# Patient Record
Sex: Male | Born: 1960 | Race: White | Hispanic: No | Marital: Married | Smoking: Former smoker
Health system: Southern US, Community
[De-identification: ages and names within clinical notes are randomized; demographics above are authoritative.]

## PROBLEM LIST (undated history)

## (undated) DIAGNOSIS — E109 Type 1 diabetes mellitus without complications: Secondary | ICD-10-CM

## (undated) DIAGNOSIS — G4733 Obstructive sleep apnea (adult) (pediatric): Secondary | ICD-10-CM

## (undated) DIAGNOSIS — IMO0002 Reserved for concepts with insufficient information to code with codable children: Secondary | ICD-10-CM

## (undated) DIAGNOSIS — E7800 Pure hypercholesterolemia, unspecified: Secondary | ICD-10-CM

## (undated) DIAGNOSIS — I4819 Other persistent atrial fibrillation: Secondary | ICD-10-CM

## (undated) DIAGNOSIS — I10 Essential (primary) hypertension: Secondary | ICD-10-CM

## (undated) DIAGNOSIS — M899 Disorder of bone, unspecified: Principal | ICD-10-CM

## (undated) DIAGNOSIS — D649 Anemia, unspecified: Secondary | ICD-10-CM

## (undated) DIAGNOSIS — D689 Coagulation defect, unspecified: Secondary | ICD-10-CM

## (undated) HISTORY — DX: Type 1 diabetes mellitus without complications: E10.9

## (undated) HISTORY — DX: Disorder of bone, unspecified: M89.9

## (undated) HISTORY — PX: WRIST FRACTURE SURGERY: SHX121

## (undated) HISTORY — DX: Other persistent atrial fibrillation: I48.19

## (undated) HISTORY — DX: Obstructive sleep apnea (adult) (pediatric): G47.33

## (undated) HISTORY — PX: KNEE SURGERY: SHX244

## (undated) HISTORY — DX: Anemia, unspecified: D64.9

## (undated) HISTORY — DX: Essential (primary) hypertension: I10

## (undated) HISTORY — DX: Reserved for concepts with insufficient information to code with codable children: IMO0002

## (undated) HISTORY — DX: Coagulation defect, unspecified: D68.9

## (undated) HISTORY — DX: Pure hypercholesterolemia, unspecified: E78.00

---

## 2005-02-25 ENCOUNTER — Ambulatory Visit: Payer: Self-pay | Admitting: Internal Medicine

## 2005-03-17 ENCOUNTER — Ambulatory Visit: Payer: Self-pay | Admitting: Internal Medicine

## 2005-05-21 ENCOUNTER — Ambulatory Visit: Payer: Self-pay | Admitting: Internal Medicine

## 2005-11-20 ENCOUNTER — Encounter: Admission: RE | Admit: 2005-11-20 | Discharge: 2005-11-20 | Payer: Self-pay | Admitting: Nephrology

## 2006-01-14 ENCOUNTER — Encounter: Admission: RE | Admit: 2006-01-14 | Discharge: 2006-04-14 | Payer: Self-pay | Admitting: Nephrology

## 2006-02-02 ENCOUNTER — Ambulatory Visit: Payer: Self-pay | Admitting: Internal Medicine

## 2006-02-09 ENCOUNTER — Ambulatory Visit: Payer: Self-pay | Admitting: Internal Medicine

## 2006-03-09 ENCOUNTER — Ambulatory Visit: Payer: Self-pay | Admitting: Internal Medicine

## 2006-07-07 ENCOUNTER — Ambulatory Visit: Payer: Self-pay | Admitting: Internal Medicine

## 2006-07-13 ENCOUNTER — Ambulatory Visit: Payer: Self-pay | Admitting: Endocrinology

## 2006-07-28 ENCOUNTER — Ambulatory Visit: Payer: Self-pay | Admitting: Endocrinology

## 2006-08-26 ENCOUNTER — Ambulatory Visit: Payer: Self-pay | Admitting: Endocrinology

## 2006-10-07 ENCOUNTER — Ambulatory Visit: Payer: Self-pay | Admitting: Endocrinology

## 2006-11-04 ENCOUNTER — Ambulatory Visit: Payer: Self-pay | Admitting: Endocrinology

## 2007-02-02 ENCOUNTER — Ambulatory Visit: Payer: Self-pay | Admitting: Endocrinology

## 2007-04-14 ENCOUNTER — Ambulatory Visit: Payer: Self-pay | Admitting: Endocrinology

## 2007-04-14 LAB — CONVERTED CEMR LAB: Hgb A1c MFr Bld: 7.7 % — ABNORMAL HIGH (ref 4.6–6.0)

## 2007-06-29 ENCOUNTER — Encounter: Payer: Self-pay | Admitting: Endocrinology

## 2007-06-29 DIAGNOSIS — I10 Essential (primary) hypertension: Secondary | ICD-10-CM

## 2007-06-29 DIAGNOSIS — E109 Type 1 diabetes mellitus without complications: Secondary | ICD-10-CM | POA: Insufficient documentation

## 2007-06-29 HISTORY — DX: Type 1 diabetes mellitus without complications: E10.9

## 2007-06-29 HISTORY — DX: Essential (primary) hypertension: I10

## 2007-07-26 ENCOUNTER — Telehealth: Payer: Self-pay | Admitting: Internal Medicine

## 2007-09-29 ENCOUNTER — Telehealth: Payer: Self-pay | Admitting: Internal Medicine

## 2007-10-13 ENCOUNTER — Emergency Department (HOSPITAL_COMMUNITY): Admission: EM | Admit: 2007-10-13 | Discharge: 2007-10-13 | Payer: Self-pay | Admitting: Emergency Medicine

## 2007-10-21 ENCOUNTER — Telehealth: Payer: Self-pay | Admitting: Internal Medicine

## 2007-12-03 ENCOUNTER — Encounter: Payer: Self-pay | Admitting: Endocrinology

## 2008-02-14 ENCOUNTER — Encounter: Payer: Self-pay | Admitting: Endocrinology

## 2008-03-15 ENCOUNTER — Encounter: Payer: Self-pay | Admitting: Endocrinology

## 2008-03-31 ENCOUNTER — Encounter: Payer: Self-pay | Admitting: Endocrinology

## 2008-04-04 ENCOUNTER — Ambulatory Visit: Payer: Self-pay | Admitting: Endocrinology

## 2008-04-19 ENCOUNTER — Encounter: Payer: Self-pay | Admitting: Endocrinology

## 2008-05-01 ENCOUNTER — Encounter: Payer: Self-pay | Admitting: Endocrinology

## 2008-05-12 ENCOUNTER — Encounter: Payer: Self-pay | Admitting: Endocrinology

## 2008-05-29 ENCOUNTER — Telehealth: Payer: Self-pay | Admitting: Endocrinology

## 2008-06-09 ENCOUNTER — Encounter: Payer: Self-pay | Admitting: Endocrinology

## 2008-06-22 ENCOUNTER — Telehealth (INDEPENDENT_AMBULATORY_CARE_PROVIDER_SITE_OTHER): Payer: Self-pay | Admitting: *Deleted

## 2008-07-24 ENCOUNTER — Telehealth: Payer: Self-pay | Admitting: Endocrinology

## 2008-10-13 ENCOUNTER — Telehealth: Payer: Self-pay | Admitting: Endocrinology

## 2008-11-17 ENCOUNTER — Telehealth (INDEPENDENT_AMBULATORY_CARE_PROVIDER_SITE_OTHER): Payer: Self-pay | Admitting: *Deleted

## 2008-12-07 ENCOUNTER — Ambulatory Visit: Payer: Self-pay | Admitting: Endocrinology

## 2009-01-30 ENCOUNTER — Ambulatory Visit: Payer: Self-pay | Admitting: Internal Medicine

## 2009-01-30 LAB — CONVERTED CEMR LAB
ALT: 28 U/L
AST: 29 U/L
Albumin: 3.7 g/dL
Alkaline Phosphatase: 61 U/L
BUN: 13 mg/dL
Basophils Absolute: 0 K/uL
Basophils Relative: 0.3 %
Bilirubin Urine: NEGATIVE
Bilirubin, Direct: 0.1 mg/dL
CO2: 30 meq/L
Calcium: 9.4 mg/dL
Chloride: 105 meq/L
Cholesterol: 142 mg/dL
Creatinine, Ser: 0.8 mg/dL
Creatinine,U: 130.4 mg/dL
Eosinophils Absolute: 0.5 K/uL
Eosinophils Relative: 6.2 % — ABNORMAL HIGH
GFR calc Af Amer: 133 mL/min
GFR calc non Af Amer: 110 mL/min
Glucose, Bld: 149 mg/dL — ABNORMAL HIGH
Glucose, Urine, Semiquant: NEGATIVE
HCT: 41.6 %
HDL: 37.6 mg/dL — ABNORMAL LOW
Hemoglobin: 14.2 g/dL
Hgb A1c MFr Bld: 7.1 % — ABNORMAL HIGH
Ketones, urine, test strip: NEGATIVE
LDL Cholesterol: 88 mg/dL
Lymphocytes Relative: 19.7 %
MCHC: 34.1 g/dL
MCV: 84.7 fL
Microalb Creat Ratio: 973.9 mg/g — ABNORMAL HIGH
Microalb, Ur: 127 mg/dL — ABNORMAL HIGH
Monocytes Absolute: 0.6 K/uL
Monocytes Relative: 7.4 %
Neutro Abs: 5.6 K/uL
Neutrophils Relative %: 66.4 %
Nitrite: NEGATIVE
Platelets: 199 K/uL
Potassium: 5.2 meq/L — ABNORMAL HIGH
RBC: 4.91 M/uL
RDW: 13.6 %
Sodium: 140 meq/L
Specific Gravity, Urine: 1.025
TSH: 1.99 u[IU]/mL
Total Bilirubin: 1 mg/dL
Total CHOL/HDL Ratio: 3.8
Total Protein: 7 g/dL
Triglycerides: 82 mg/dL
Urobilinogen, UA: 0.2
VLDL: 16 mg/dL
WBC Urine, dipstick: NEGATIVE
WBC: 8.4 10*3/microliter
pH: 5.5

## 2009-02-01 ENCOUNTER — Ambulatory Visit: Payer: Self-pay | Admitting: Internal Medicine

## 2009-02-12 ENCOUNTER — Telehealth: Payer: Self-pay | Admitting: Endocrinology

## 2009-03-13 ENCOUNTER — Telehealth: Payer: Self-pay | Admitting: Internal Medicine

## 2009-04-10 ENCOUNTER — Telehealth: Payer: Self-pay | Admitting: Endocrinology

## 2009-04-27 ENCOUNTER — Telehealth (INDEPENDENT_AMBULATORY_CARE_PROVIDER_SITE_OTHER): Payer: Self-pay | Admitting: *Deleted

## 2009-05-08 ENCOUNTER — Ambulatory Visit: Payer: Self-pay | Admitting: Endocrinology

## 2009-05-08 LAB — CONVERTED CEMR LAB: Hgb A1c MFr Bld: 6.9 % — ABNORMAL HIGH (ref 4.6–6.5)

## 2009-06-25 ENCOUNTER — Telehealth: Payer: Self-pay | Admitting: Endocrinology

## 2009-07-24 ENCOUNTER — Telehealth: Payer: Self-pay | Admitting: Endocrinology

## 2009-07-26 ENCOUNTER — Encounter: Payer: Self-pay | Admitting: Endocrinology

## 2009-11-12 ENCOUNTER — Telehealth: Payer: Self-pay | Admitting: Endocrinology

## 2009-12-03 ENCOUNTER — Telehealth: Payer: Self-pay | Admitting: Endocrinology

## 2010-03-18 ENCOUNTER — Telehealth: Payer: Self-pay | Admitting: Endocrinology

## 2010-04-23 ENCOUNTER — Telehealth: Payer: Self-pay | Admitting: Endocrinology

## 2010-05-08 ENCOUNTER — Telehealth: Payer: Self-pay | Admitting: Internal Medicine

## 2010-05-14 ENCOUNTER — Telehealth: Payer: Self-pay | Admitting: Endocrinology

## 2010-05-20 ENCOUNTER — Ambulatory Visit: Payer: Self-pay | Admitting: Endocrinology

## 2010-05-20 DIAGNOSIS — E7800 Pure hypercholesterolemia, unspecified: Secondary | ICD-10-CM | POA: Insufficient documentation

## 2010-05-20 DIAGNOSIS — R809 Proteinuria, unspecified: Secondary | ICD-10-CM | POA: Insufficient documentation

## 2010-05-20 HISTORY — DX: Pure hypercholesterolemia, unspecified: E78.00

## 2010-05-20 LAB — CONVERTED CEMR LAB: Hgb A1c MFr Bld: 7.5 % — ABNORMAL HIGH (ref 4.6–6.5)

## 2010-05-22 LAB — CONVERTED CEMR LAB
Albumin: 3.8 g/dL (ref 3.5–5.2)
Bilirubin, Direct: 0.1 mg/dL (ref 0.0–0.3)
CO2: 31 meq/L (ref 19–32)
Calcium: 9.2 mg/dL (ref 8.4–10.5)
Creatinine, Ser: 0.7 mg/dL (ref 0.4–1.5)
Creatinine,U: 104.5 mg/dL
GFR calc non Af Amer: 126.97 mL/min (ref >60)
HDL: 40.3 mg/dL (ref >39.00)
Total CHOL/HDL Ratio: 3
Total Protein: 6.9 g/dL (ref 6.0–8.3)
Triglycerides: 130 mg/dL (ref 0.0–149.0)
VLDL: 26 mg/dL (ref 0.0–40.0)

## 2010-05-24 ENCOUNTER — Telehealth: Payer: Self-pay | Admitting: Endocrinology

## 2010-06-24 ENCOUNTER — Telehealth: Payer: Self-pay | Admitting: Endocrinology

## 2010-07-03 ENCOUNTER — Telehealth: Payer: Self-pay | Admitting: Endocrinology

## 2010-07-10 ENCOUNTER — Telehealth: Payer: Self-pay | Admitting: Endocrinology

## 2010-07-25 ENCOUNTER — Telehealth: Payer: Self-pay | Admitting: Endocrinology

## 2010-08-13 ENCOUNTER — Encounter: Payer: Self-pay | Admitting: Endocrinology

## 2010-08-21 ENCOUNTER — Ambulatory Visit: Payer: Self-pay | Admitting: Endocrinology

## 2010-08-21 LAB — CONVERTED CEMR LAB: Hgb A1c MFr Bld: 8 % — ABNORMAL HIGH (ref 4.6–6.5)

## 2010-08-27 ENCOUNTER — Telehealth: Payer: Self-pay | Admitting: Endocrinology

## 2010-09-26 ENCOUNTER — Telehealth: Payer: Self-pay | Admitting: Endocrinology

## 2010-10-15 ENCOUNTER — Telehealth: Payer: Self-pay | Admitting: Endocrinology

## 2010-11-13 ENCOUNTER — Ambulatory Visit: Payer: Self-pay | Admitting: Endocrinology

## 2010-11-13 LAB — CONVERTED CEMR LAB: Hgb A1c MFr Bld: 8.2 % — ABNORMAL HIGH (ref 4.6–6.5)

## 2010-12-21 ENCOUNTER — Encounter: Payer: Self-pay | Admitting: Nephrology

## 2010-12-24 ENCOUNTER — Ambulatory Visit
Admission: RE | Admit: 2010-12-24 | Discharge: 2010-12-24 | Payer: Self-pay | Source: Home / Self Care | Attending: Internal Medicine | Admitting: Internal Medicine

## 2010-12-24 DIAGNOSIS — J069 Acute upper respiratory infection, unspecified: Secondary | ICD-10-CM | POA: Insufficient documentation

## 2010-12-31 ENCOUNTER — Encounter (INDEPENDENT_AMBULATORY_CARE_PROVIDER_SITE_OTHER): Payer: Self-pay | Admitting: *Deleted

## 2010-12-31 NOTE — Progress Notes (Signed)
Summary: novolog  Phone Note Refill Request Message from:  Fax from Pharmacy on July 25, 2010 1:39 PM  Refills Requested: Medication #1:  NOVOLOG FLEXPEN 100 UNIT/ML  SOLN three times a day (qac) 30-0-60 units   Dosage confirmed as above?Dosage Confirmed  Method Requested: Electronic Initial call taken by: Brenton Grills MA,  July 25, 2010 1:40 PM    Prescriptions: NOVOLOG FLEXPEN 100 UNIT/ML  SOLN (INSULIN ASPART) three times a day (qac) 30-0-60 units  #45 x 3   Entered by:   Brenton Grills MA   Authorized by:   Minus Breeding MD   Signed by:   Brenton Grills MA on 07/25/2010   Method used:   Electronically to        [facility]* (retail)       [address] [place], [place]  [address]       Ph: [phone] or [phone]       Fax: [phone]   RxID:   [id number]

## 2010-12-31 NOTE — Letter (Signed)
Summary: [facility]   Imported By: Sherian Rein 08/26/2010 08:35:38  _____________________________________________________________________  External Attachment:    Type:   Image     Comment:   External Document

## 2010-12-31 NOTE — Progress Notes (Signed)
Summary: lisinopril  Phone Note Refill Request Message from:  Fax from Pharmacy  Refills Requested: Medication #1:  LISINOPRIL 20 MG  TABS take 1 by mouth qd   Dosage confirmed as above?Dosage Confirmed   Last Refilled: 05/14/2010 Next Appointment Scheduled: 08/23/10 Initial call taken by: Brenton Grills MA,  July 03, 2010 10:30 AM    Prescriptions: LISINOPRIL 20 MG  TABS (LISINOPRIL) take 1 by mouth qd  #30 x 2   Entered by:   Brenton Grills MA   Authorized by:   Minus Breeding MD   Signed by:   Brenton Grills MA on 07/03/2010   Method used:   Faxed to ...       [facility]* (retail)       [address] [place], [place]  [address]       Ph: [phone] or [phone]       Fax: [phone]   RxID:   [id number]

## 2010-12-31 NOTE — Progress Notes (Signed)
  Phone Note Refill Request Message from:  Fax from Pharmacy  Refills Requested: Medication #1:  LANTUS SOLOSTAR 100 UNIT/ML SOLN Inject 90 unit subcutaneously every night   Dosage confirmed as above?Dosage Confirmed  Method Requested: Fax to Local Pharmacy Initial call taken by: Brenton Grills MA,  June 24, 2010 10:03 AM    Prescriptions: LANTUS SOLOSTAR 100 UNIT/ML SOLN (INSULIN GLARGINE) Inject 90 unit subcutaneously every night  #109ml x 6   Entered by:   Brenton Grills MA   Authorized by:   Minus Breeding MD   Signed by:   Brenton Grills MA on 06/24/2010   Method used:   Faxed to ...       [facility]* (retail)       [address] [place], [place]  [address]       Ph: [phone] or [phone]       Fax: [phone]   RxID:   [id number]

## 2010-12-31 NOTE — Progress Notes (Signed)
Summary: zocor  Phone Note Refill Request Message from:  Fax from Pharmacy on August 27, 2010 1:58 PM  Refills Requested: Medication #1:  ZOCOR 80 MG  TABS take 1 at bedtime   Dosage confirmed as above?Dosage Confirmed   Last Refilled: 07/25/2010  Method Requested: Electronic Initial call taken by: Brenton Grills MA,  August 27, 2010 1:58 PM    Prescriptions: ZOCOR 80 MG  TABS (SIMVASTATIN) take 1 at bedtime  #30 x 3   Entered by:   Brenton Grills MA   Authorized by:   Minus Breeding MD   Signed by:   Brenton Grills MA on 08/27/2010   Method used:   Electronically to        [facility]* (retail)       [address] [place], [place]  [address]       Ph: [phone] or [phone]       Fax: [phone]   RxID:   [id number]

## 2010-12-31 NOTE — Progress Notes (Signed)
  Phone Note Refill Request Message from:  Fax from Pharmacy  Refills Requested: Medication #1:  NOVOLOG FLEXPEN 100 UNIT/ML  SOLN three times a day (qac) 30-0-60 units   Dosage confirmed as above?Dosage Confirmed Initial call taken by: Brenton Grills MA,  May 24, 2010 4:47 PM    Prescriptions: NOVOLOG FLEXPEN 100 UNIT/ML  SOLN (INSULIN ASPART) three times a day (qac) 30-0-60 units  #45 x 1   Entered by:   Brenton Grills MA   Authorized by:   Minus Breeding MD   Signed by:   Brenton Grills MA on 05/24/2010   Method used:   Electronically to        [facility]* (retail)       [address] [place], [place]  [address]       Ph: [phone] or [phone]       Fax: [phone]   RxID:   [id number]

## 2010-12-31 NOTE — Assessment & Plan Note (Signed)
Summary: 3 MTH FU STC   Vital Signs:  Patient profile:   50 year old male Height:      75 inches (190.50 cm) Weight:      305 pounds (138.64 kg) BMI:     38.26 O2 Sat:      96 % on Room air Temp:     98.5 degrees F (36.94 degrees C) oral Pulse rate:   89 / minute BP sitting:   122 / 84  (left arm) Cuff size:   large  Vitals Entered By: Brenton Grills MA (August 21, 2010 7:58 AM)  O2 Flow:  Room air CC: 3 month F/U/aj Is Patient Diabetic? Yes   CC:  3 month F/U/aj.  History of Present Illness: pt states he feels well in general.   no cbg record, but states cbg's are highest at hs, and in am (200).  it is lowest in the afternoon.  he says it sometimes goes low in the middle of the night, if he does not eat hs-snack.    Current Medications (verified): 1)  Novolog Flexpen 100 Unit/ml  Soln (Insulin Aspart) .... Three Times A Day (Qac) 30-0-60 Units 2)  Lisinopril 20 Mg  Tabs (Lisinopril) .... Take 1 By Mouth Qd 3)  Zocor 80 Mg  Tabs (Simvastatin) .... Take 1 At Bedtime 4)  Lantus Solostar 100 Unit/ml Soln (Insulin Glargine) .... Inject 90 Unit Subcutaneously Every Night 5)  Bd U/f Iii Short Pen Needle 31g X 8 Mm  Misc (Insulin Pen Needle) .... Use As Directed 6)  Onetouch Ultra Test   Strp (Glucose Blood) .... Check Blood Glucose 5 Per Day, 250.01, and Lancets  Allergies (verified): No Known Drug Allergies  Past History:  Past Medical History: Last updated: 02/01/2009 Diabetes mellitus, type I Hypertension Dyslipidemia DM Nephropathy  Review of Systems  The patient denies syncope.    Physical Exam  General:  obese.  no distress  Skin:  insulin injection sites at anterior thighs are normal  Additional Exam:   Hemoglobin A1C       [H]  8.0 %   Impression & Recommendations:  Problem # 1:  DIABETES MELLITUS, TYPE I (ICD-250.01) needs increased rx  Medications Added to Medication List This Visit: 1)  Novolog Flexpen 100 Unit/ml Soln (Insulin aspart) ....  Three times a day (qac) 40-0-70 units 2)  Lantus Solostar 100 Unit/ml Soln (Insulin glargine) .... Inject 80 unit subcutaneously every night  Other Orders: Admin 1st Vaccine ([address]) Flu Vaccine 34yrs + ([address]) TLB-A1C / Hgb A1C (Glycohemoglobin) (83036-A1C) Est. Patient Level III ([address])  Patient Instructions: 1)  blood tests are being ordered for you today.  please call [phone] to hear your test results. 2)  pending the test results, please decrease lantus to 80 units at bedtime.   3)  increase novolog to (just before each meal) 40-0-70 units 4)  Please schedule a follow-up appointment in 3 months. 5)  check your blood sugar 2 times a day.  vary the time of day when you check, between before the 3 meals, and at bedtime.  also check if you have symptoms of your blood sugar being too high or too low.  please keep a record of the readings and bring it to your next appointment here.  please call us sooner if you are having low blood sugar episodes. 6)  (update: i left message on phone-tree:  rx as we discussed) Prescriptions: LANTUS SOLOSTAR 100 UNIT/ML SOLN (INSULIN GLARGINE) Inject 80 unit subcutaneously every  night  #2 boxes x 11   Entered and Authorized by:   Minus Breeding MD   Signed by:   Minus Breeding MD on 08/21/2010   Method used:   Electronically to        [facility]* (retail)       [address] [place], [place]  [address]       Ph: [phone] or [phone]       Fax: [phone]   RxID:   [id number] NOVOLOG FLEXPEN 100 UNIT/ML  SOLN (INSULIN ASPART) three times a day (qac) 40-0-70 units  #3 boxes x 11   Entered and Authorized by:   Minus Breeding MD   Signed by:   Minus Breeding MD on 08/21/2010   Method used:   Electronically to        [facility]* (retail)       [address] [place], [place]  [address]       Ph: [phone] or  [phone]       Fax: [phone]   RxID:   [id number]   Flu Vaccine Consent Questions     Do you have a history of severe allergic reactions to this vaccine? no    Any prior history of allergic reactions to egg and/or gelatin? no    Do you have a sensitivity to the preservative Thimersol? no    Do you have a past history of Guillan-Barre Syndrome? no    Do you currently have an acute febrile illness? no    Have you ever had a severe reaction to latex? no    Vaccine information given and explained to patient? yes    Are you currently pregnant? no    Lot Number:[id number]   Exp Date:05/31/2011   Site Given  Left Deltoid IMlu

## 2010-12-31 NOTE — Assessment & Plan Note (Signed)
Summary: f/u,refill/#/cd   Vital Signs:  Patient profile:   50 year old male Weight:      298 pounds BMI:     37.38 Temp:     97.8 degrees F Pulse rate:   90 / minute BP sitting:   130 / 88  (left arm) Cuff size:   large  Vitals Entered By: Lamar Sprinkles, CMA (May 20, 2010 8:08 AM) CC: F/u Needs refills / SD Comments Lantus needs to be removed from pt's allergy list, he is currently taking medication w/no problems   CC:  F/u Needs refills / SD.  History of Present Illness: pt states weight gain despite his efforts.  he has mild hypoglycemia when he eats a smaller-than-expected meal.  no cbg record, but states cbg's are lowest in the afternoon.    Current Medications (verified): 1)  Novolog Flexpen 100 Unit/ml  Soln (Insulin Aspart) .... Three Times A Day (Qac) 30-15-60 Units 2)  Lisinopril 20 Mg  Tabs (Lisinopril) .... Take 1 By Mouth Qd 3)  Zocor 80 Mg  Tabs (Simvastatin) .... Take 1 At Bedtime 4)  Lantus Solostar 100 Unit/ml Soln (Insulin Glargine) .... Inject 90 Unit Subcutaneously Every Night 5)  Bd U/f Iii Short Pen Needle 31g X 8 Mm  Misc (Insulin Pen Needle) .... Use As Directed 6)  Onetouch Ultra Test   Strp (Glucose Blood) .... Check Blood Glucose 5 Per Day, 250.01, and Lancets  Past History:  Past Medical History: Last updated: 02/01/2009 Diabetes mellitus, type I Hypertension Dyslipidemia DM Nephropathy  Social History: Reviewed history from 02/01/2009 and no changes required. 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    Physical Exam  General:  obese.   Pulses:  dorsalis pedis intact bilat.  no carotid bruit  Extremities:  no deformity.  no ulcer on the feet.  feet are of normal color and temp.  no edema  Neurologic:  cn 2-12 grossly intact.   readily moves all 4's.    Additional Exam:  Hemoglobin A1C       [H]  7.5 %     Impression & Recommendations:  Problem # 1:  DIABETES MELLITUS,  TYPE I (ICD-250.01) needs increased rx  Problem # 2:  PROTEINURIA, MILD (ICD-791.0) Assessment: New  Medications Added to Medication List This Visit: 1)  Novolog Flexpen 100 Unit/ml Soln (Insulin aspart) .... Three times a day (qac) 30-0-60 units  Other Orders: TLB-Hemoglobin (Hgb) (85018-HGB) TLB-Microalbumin/Creat Ratio, Urine (82043-MALB) TLB-Lipid Panel (80061-LIPID) TLB-BMP (Basic Metabolic Panel-BMET) (80048-METABOL) TLB-Hepatic/Liver Function Pnl (80076-HEPATIC) TLB-TSH (Thyroid Stimulating Hormone) (84443-TSH) Est. Patient Level III ([address])  Patient Instructions: 1)  continue lantus to 90 units at bedtime 2)  reduce novolog to (just before each meal) 30-0-60 units 3)  Please schedule a follow-up appointment in 3 months. 4)  check your blood sugar 2 times a day.  vary the time of day when you check, between before the 3 meals, and at bedtime.  also check if you have symptoms of your blood sugar being too high or too low.  please keep a record of the readings and bring it to your next appointment here.  please call us sooner if you are having low blood sugar episodes. 5)  (update: i left message on phone-tree:  call back, and tell me what time of day cbg is highest, so we can safely increase insulin).

## 2010-12-31 NOTE — Progress Notes (Signed)
Summary: Rx refill req  Phone Note Refill Request Message from:  Patient  Refills Requested: Medication #1:  LISINOPRIL 20 MG  TABS take 1 by mouth qd   Dosage confirmed as above?Dosage Confirmed   Supply Requested: 1 month Pt had appt with [facility]E that was re-scheduled to 06/20 due to MD illness.  Next Appointment Scheduled: 06.20.2011 Initial call taken by: Margaret Pyle, CMA,  May 14, 2010 4:44 PM    Prescriptions: LISINOPRIL 20 MG  TABS (LISINOPRIL) take 1 by mouth qd  #30 x 0   Entered by:   Margaret Pyle, CMA   Authorized by:   Minus Breeding MD   Signed by:   Margaret Pyle, CMA on 05/14/2010   Method used:   Electronically to        [facility]* (retail)       [address] [place], [place]  [address]       Ph: [phone] or [phone]       Fax: [phone]   RxID:   [id number]

## 2010-12-31 NOTE — Progress Notes (Signed)
Summary: One touch test strip refill  Phone Note Refill Request Message from:  Fax from Pharmacy on December 03, 2009 8:38 AM  Refills Requested: Medication #1:  ONETOUCH ULTRA TEST   STRP CHECK BLOOD GLUCOSE 5 per day   Dosage confirmed as above?Dosage Confirmed Initial call taken by: Josph Macho CMA,  December 03, 2009 8:38 AM    Prescriptions: [place] TEST   STRP (GLUCOSE BLOOD) CHECK BLOOD GLUCOSE 5 per day, 250.01, and lancets  #150 x 3   Entered by:   Josph Macho CMA   Authorized by:   Minus Breeding MD   Signed by:   Josph Macho CMA on 12/03/2009   Method used:   Electronically to        [facility]* (retail)       [address] [place], [place]  [address]       Ph: [phone] or [phone]       Fax: [phone]   RxID:   [id number]

## 2010-12-31 NOTE — Progress Notes (Signed)
  Phone Note Refill Request Message from:  Fax from Pharmacy on March 18, 2010 9:00 AM  Refills Requested: Medication #1:  ZOCOR 80 MG  TABS take 1 at bedtime   Dosage confirmed as above?Dosage Confirmed Initial call taken by: Josph Macho RMA,  March 18, 2010 9:00 AM    Prescriptions: ZOCOR 80 MG  TABS (SIMVASTATIN) take 1 at bedtime  #30 x 4   Entered by:   Josph Macho RMA   Authorized by:   Minus Breeding MD   Signed by:   Josph Macho RMA on 03/18/2010   Method used:   Electronically to        [facility]* (retail)       [address] [place], [place]  [address]       Ph: [phone] or [phone]       Fax: [phone]   RxID:   [id number]

## 2010-12-31 NOTE — Progress Notes (Signed)
Summary: rx refill req  Phone Note Refill Request Message from:  Fax from Pharmacy on October 15, 2010 11:54 AM  Refills Requested: Medication #1:  LISINOPRIL 20 MG  TABS take 1 by mouth qd   Dosage confirmed as above?Dosage Confirmed   Last Refilled: 09/12/2010  Method Requested: Electronic Next Appointment Scheduled: 11/13/2010 Initial call taken by: Brenton Grills CMA Duncan Dull),  October 15, 2010 11:55 AM    Prescriptions: LISINOPRIL 20 MG  TABS (LISINOPRIL) take 1 by mouth qd  #30 x 5   Entered by:   Brenton Grills CMA (AAMA)   Authorized by:   Minus Breeding MD   Signed by:   Brenton Grills CMA (AAMA) on 10/15/2010   Method used:   Electronically to        [facility]* (retail)       [address] [place], [place]  [address]       Ph: [phone] or [phone]       Fax: [phone]   RxID:   [id number]

## 2010-12-31 NOTE — Progress Notes (Signed)
  Phone Note Refill Request Message from:  Fax from Pharmacy on July 10, 2010 11:23 AM  Refills Requested: Medication #1:  ONETOUCH ULTRA TEST   STRP CHECK BLOOD GLUCOSE 5 per day   Dosage confirmed as above?Dosage Confirmed  Method Requested: Electronic Initial call taken by: Brenton Grills MA,  July 10, 2010 11:39 AM    Prescriptions: [place]n Bound TEST   STRP (GLUCOSE BLOOD) CHECK BLOOD GLUCOSE 5 per day, 250.01, and lancets  #150 x 2   Entered by:   Brenton Grills MA   Authorized by:   Minus Breeding MD   Signed by:   Brenton Grills MA on 07/10/2010   Method used:   Electronically to        [facility]* (retail)       [address] [place], [place]  [address]       Ph: [phone] or [phone]       Fax: [phone]   RxID:   [id number]

## 2010-12-31 NOTE — Progress Notes (Signed)
Summary: rx refill req  Phone Note Refill Request Message from:  Fax from Pharmacy on September 26, 2010 1:25 PM  Refills Requested: Medication #1:  ONETOUCH ULTRA TEST   STRP CHECK BLOOD GLUCOSE 5 per day   Dosage confirmed as above?Dosage Confirmed   Last Refilled: 08/27/2010  Method Requested: Electronic Next Appointment Scheduled: 11/13/2010 Initial call taken by: Brenton Grills MA,  September 26, 2010 1:25 PM    Prescriptions: [place]n Bound TEST   STRP (GLUCOSE BLOOD) CHECK BLOOD GLUCOSE 5 per day, 250.01, and lancets  #150 x 3   Entered by:   Brenton Grills MA   Authorized by:   Minus Breeding MD   Signed by:   Brenton Grills MA on 09/26/2010   Method used:   Electronically to        [facility]* (retail)       [address] [place], [place]  [address]       Ph: [phone] or [phone]       Fax: [phone]   RxID:   [id number]

## 2010-12-31 NOTE — Progress Notes (Signed)
Summary: lisinopril deined  Phone Note From Pharmacy Call back at [phone] fax   Caller: [facility] Call For: swords  Summary of Call: refill lisinopril 20mg  Take 1 tablet by mouth once a day . Last seen 02/01/10.  This is denied as needs ov. Initial call taken by: Gladis Riffle, RN,  May 08, 2010 11:32 AM

## 2010-12-31 NOTE — Progress Notes (Signed)
  Phone Note Refill Request Message from:  Fax from Pharmacy on Apr 23, 2010 8:54 AM  Refills Requested: Medication #1:  ONETOUCH ULTRA TEST   STRP CHECK BLOOD GLUCOSE 5 per day   Dosage confirmed as above?Dosage Confirmed Initial call taken by: Josph Macho RMA,  Apr 23, 2010 8:54 AM    Prescriptions: [place]n Bound TEST   STRP (GLUCOSE BLOOD) CHECK BLOOD GLUCOSE 5 per day, 250.01, and lancets  #150 x 1   Entered by:   Josph Macho RMA   Authorized by:   Minus Breeding MD   Signed by:   Josph Macho RMA on 04/23/2010   Method used:   Electronically to        [facility]* (retail)       [address] [place], [place]  [address]       Ph: [phone] or [phone]       Fax: [phone]   RxID:   [id number]

## 2011-01-02 ENCOUNTER — Telehealth: Payer: Self-pay | Admitting: *Deleted

## 2011-01-02 DIAGNOSIS — R05 Cough: Secondary | ICD-10-CM

## 2011-01-02 MED ORDER — HYDROCODONE-HOMATROPINE 5-1.5 MG/5ML PO SYRP: 240.0000 mL | ORAL_SOLUTION | Freq: Three times a day (TID) | ORAL | Status: AC | PRN

## 2011-01-02 NOTE — Telephone Encounter (Signed)
Refill hydromet cough syrup 1 tsp po tid prn for cough

## 2011-01-02 NOTE — Telephone Encounter (Signed)
Ok x one

## 2011-01-02 NOTE — Assessment & Plan Note (Signed)
Summary: cough/njr   Vital Signs:  Patient profile:   50 year old male Weight:      318 pounds Temp:     98.1 degrees F oral BP sitting:   124 / 82  (left arm) Cuff size:   large  Vitals Entered By: Alfred Levins, CMA (December 24, 2010 11:12 AM) CC: cough and congestion x2 days, coughed so hard ribs hurt   CC:  cough and congestion x2 days and coughed so hard ribs hurt.  History of Present Illness: cough for 3 days, no fever or chills,  no sweats.  cough is nonproductive nocturnal cough is bothersome  Current Medications (verified): 1)  Novolog Flexpen 100 Unit/ml  Soln (Insulin Aspart) .... Three Times A Day (Qac) 40-0-80 Units 2)  Lisinopril 20 Mg  Tabs (Lisinopril) .... Take 1 By Mouth Qd 3)  Zocor 80 Mg  Tabs (Simvastatin) .... Take 1 At Bedtime 4)  Lantus Solostar 100 Unit/ml Soln (Insulin Glargine) .... Inject 80 Unit Subcutaneously Every Night 5)  Bd U/f Iii Short Pen Needle 31g X 8 Mm  Misc (Insulin Pen Needle) .... Use As Directed 6)  Onetouch Ultra Test   Strp (Glucose Blood) .... 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   Eyes:  pupils equal and pupils round.   Lungs:  Normal respiratory effort, chest expands symmetrically. Lungs are clear to auscultation, no crackles or wheezes.   Impression & Recommendations:  Problem # 1:  URI (ICD-465.9) no evidence of bacterial infection. call for any concerns, increased sxs, fever, persistence of sxs,  wheeze, SOB.   His updated medication list for this problem includes:    Hydrocodone-homatropine 5-1.5 Mg Tabs (Hydrocodone-homatropine) .Marland Kitchen... 1 tsp three times a day as needed  Complete Medication List: 1)  Novolog Flexpen 100 Unit/ml Soln (Insulin aspart) .... Three times a day (qac) 40-0-80 units 2)  Lisinopril 20 Mg Tabs (Lisinopril) .... Take 1 by mouth qd 3)  Zocor 80 Mg Tabs (Simvastatin) .... Take 1 at bedtime 4)  Lantus Solostar 100 Unit/ml Soln (Insulin glargine) .... Inject 80 unit subcutaneously every night 5)  Bd U/f Iii Short Pen Needle 31g X 8 Mm Misc (Insulin pen needle) .... Use as directed 6)  Onetouch Ultra Test Strp (Glucose blood) .... Check blood glucose 5 per day, 250.01, and lancets 7)  Hydrocodone-homatropine 5-1.5 Mg Tabs (Hydrocodone-homatropine) .Marland Kitchen.. 1 tsp three times a day as needed  Other Orders: Gastroenterology Referral (GI) Prescriptions: HYDROCODONE-HOMATROPINE 5-1.5 MG TABS (HYDROCODONE-HOMATROPINE) 1 tsp three times a day as needed  #240 cc x 0   Entered and Authorized by:   Birdie Sons MD   Signed by:   Birdie Sons MD on 12/24/2010   Method used:   Print then Give to Patient   RxID:   [id number]    Orders Added: 1)  Gastroenterology Referral [GI]

## 2011-01-02 NOTE — Assessment & Plan Note (Signed)
Summary: 3 MTH FU---STC   Vital Signs:  Patient profile:   50 year old male Height:      75 inches (190.50 cm) Weight:      315.25 pounds (143.30 kg) BMI:     39.55 O2 Sat:      97 % on Room air Temp:     98.7 degrees F (37.06 degrees C) oral Pulse rate:   97 / minute BP sitting:   122 / 88  (left arm) Cuff size:   large  Vitals Entered By: Brenton Grills CMA Duncan Dull) (November 13, 2010 7:57 AM)  O2 Flow:  Room air CC: 3 month F/U/aj Is Patient Diabetic? Yes Comments per pt, pt is due for colonoscopy   CC:  3 month F/U/aj.  History of Present Illness: pt states he feels well in general.   no cbg record, but states cbg's are highest in am (200), but he does not check at hs.  it is lowest in the afternoon, if he is active.   Current Medications (verified): 1)  Novolog Flexpen 100 Unit/ml  Soln (Insulin Aspart) .... Three Times A Day (Qac) 40-0-70 Units 2)  Lisinopril 20 Mg  Tabs (Lisinopril) .... Take 1 By Mouth Qd 3)  Zocor 80 Mg  Tabs (Simvastatin) .... Take 1 At Bedtime 4)  Lantus Solostar 100 Unit/ml Soln (Insulin Glargine) .... Inject 80 Unit Subcutaneously Every Night 5)  Bd U/f Iii Short Pen Needle 31g X 8 Mm  Misc (Insulin Pen Needle) .... Use As Directed 6)  Onetouch Ultra Test   Strp (Glucose Blood) .... Check Blood Glucose 5 Per Day, 250.01, and Lancets  Allergies (verified): No Known Drug Allergies  Past History:  Past Medical History: Last updated: 02/01/2009 Diabetes mellitus, type I Hypertension Dyslipidemia DM Nephropathy  Social History: Reviewed history from 05/20/2010 and no changes required. Occupation:  paving Married Never Smoked 5 kids  Review of Systems  The patient denies hypoglycemia.    Physical Exam  General:  obese.  no distress  Pulses:  dorsalis pedis intact bilat.    Extremities:  no deformity.  no ulcer on the feet.  feet are of normal color and temp.  no edema  Neurologic:  sensation is intact to touch on the feet, but  decreased from normal  Additional Exam:  Hemoglobin A1C       [H]  8.2 %     Impression & Recommendations:  Problem # 1:  DIABETES MELLITUS, TYPE I (ICD-250.01) needs increased rx  Medications Added to Medication List This Visit: 1)  Novolog Flexpen 100 Unit/ml Soln (Insulin aspart) .... Three times a day (qac) 40-0-80 units  Other Orders: TLB-A1C / Hgb A1C (Glycohemoglobin) (83036-A1C) Est. Patient Level III ([address])  Patient Instructions: 1)  blood tests are being ordered for you today.  please call [phone] to hear your test results. 2)  pending the test results, please continue lantus 80 units at bedtime, and increase novolog to (just before each meal) 40-0-80 units 3)  Please schedule a follow-up appointment in 3 months. 4)  check your blood sugar 2 times a day.  vary the time of day when you check, between before the 3 meals, and at bedtime.  also check if you have symptoms of your blood sugar being too high or too low.  please keep a record of the readings and bring it to your next appointment here.  please call us sooner if you are having low blood sugar episodes. 5)  please make  appt with dr swords, as you are due.   6)  (update: i left message on phone-tree:  increase humalog to (just before each meal) 45-0-85 units).   Orders Added: 1)  TLB-A1C / Hgb A1C (Glycohemoglobin) [83036-A1C] 2)  Est. Patient Level III [[address]]     Preventive Care Screening  Last Tetanus Booster:    Date:  12/01/2008    Results:  Historical

## 2011-01-08 NOTE — Letter (Signed)
Summary: Pre Visit Letter Revised  [facility]  [address] [place], [place] [address]   Phone: [phone]  Fax: [phone]        12/31/2010 MRN: [id number] [facility] [place] [address] [place], [place]  [address]             Procedure Date:  01/30/2011 @ 8:30   Direct colon-Dr. Arlyce Dice      Welcome to the Gastroenterology Division at [facility].    You are scheduled to see a nurse for your pre-procedure visit on 01/16/2011 at 4:30 on the 3rd floor at [facility], [address]  We ask that you try to arrive at our office 15 minutes prior to your appointment time to allow for check-in.  Please take a minute to review the attached form.  If you answer "Yes" to one or more of the questions on the first page, we ask that you call the person listed at your earliest opportunity.  If you answer "No" to all of the questions, please complete the rest of the form and bring it to your appointment.    Your nurse visit will consist of discussing your medical and surgical history, your immediate family medical history, and your medications.   If you are unable to list all of your medications on the form, please bring the medication bottles to your appointment and we will list them.  We will need to be aware of both prescribed and over the counter drugs.  We will need to know exact dosage information as well.    Please be prepared to read and sign documents such as consent forms, a financial agreement, and acknowledgement forms.  If necessary, and with your consent, a friend or relative is welcome to sit-in on the nurse visit with you.  Please bring your insurance card so that we may make a copy of it.  If your insurance requires a referral to see a specialist, please bring your referral form from your primary care physician.  No co-pay is required for this nurse visit.     If you cannot keep your appointment, please call [phone] to cancel or reschedule prior to your  appointment date.  This allows [place] the opportunity to schedule an appointment for another patient in need of care.    Thank you for choosing [facility] for your medical needs.  We appreciate the opportunity to care for you.  Please visit [place] at our website  to learn more about our practice.  Sincerely, The Gastroenterology Division

## 2011-01-14 ENCOUNTER — Encounter (INDEPENDENT_AMBULATORY_CARE_PROVIDER_SITE_OTHER): Payer: Self-pay | Admitting: *Deleted

## 2011-01-16 ENCOUNTER — Telehealth: Payer: Self-pay | Admitting: Endocrinology

## 2011-01-16 ENCOUNTER — Encounter: Payer: Self-pay | Admitting: Gastroenterology

## 2011-01-16 ENCOUNTER — Encounter (INDEPENDENT_AMBULATORY_CARE_PROVIDER_SITE_OTHER): Payer: Self-pay | Admitting: *Deleted

## 2011-01-22 NOTE — Letter (Signed)
Summary: [facility] Instructions  [facility]  [address] [place], [place] [address]   Phone: [phone]  Fax: [phone]       Derek Herrera    02-Jul-1961    MRN: [id number]        Procedure Day Dorna Bloom:  Lenor Coffin 01/30/11     Arrival Time:  7:30am     Procedure Time:  8:30am     Location of Procedure:                    _X_  [facility] (4th Floor)                        PREPARATION FOR COLONOSCOPY WITH MOVIPREP   Starting 5 days prior to your procedure SATURDAY 02/25 do not eat nuts, seeds, popcorn, corn, beans, peas,  salads, or any raw vegetables.  Do not take any fiber supplements (e.g. Metamucil, Citrucel, and Benefiber).  THE DAY BEFORE YOUR PROCEDURE         DATE: [facility]  02/29  1.  Drink clear liquids the entire day-NO SOLID FOOD  2.  Do not drink anything colored red or purple.  Avoid juices with pulp.  No orange juice.  3.  Drink at least 64 oz. (8 glasses) of fluid/clear liquids during the day to prevent dehydration and help the prep work efficiently.  CLEAR LIQUIDS INCLUDE: Water Jello Ice Popsicles Tea (sugar ok, no milk/cream) Powdered fruit flavored drinks Coffee (sugar ok, no milk/cream) Gatorade Juice: apple, white grape, white cranberry  Lemonade Clear bullion, consomm, broth Carbonated beverages (any kind) Strained chicken noodle soup Hard Candy                             4.  In the morning, mix first dose of MoviPrep solution:    Empty 1 Pouch A and 1 Pouch B into the disposable container    Add lukewarm drinking water to the top line of the container. Mix to dissolve    Refrigerate (mixed solution should be used within 24 hrs)  5.  Begin drinking the prep at 5:00 p.m. The MoviPrep container is divided by 4 marks.   Every 15 minutes drink the solution down to the next mark (approximately 8 oz) until the full liter is complete.   6.  Follow completed prep with 16 oz of clear liquid of your choice  (Nothing red or purple).  Continue to drink clear liquids until bedtime.  7.  Before going to bed, mix second dose of MoviPrep solution:    Empty 1 Pouch A and 1 Pouch B into the disposable container    Add lukewarm drinking water to the top line of the container. Mix to dissolve    Refrigerate  THE DAY OF YOUR PROCEDURE      DATE: THURSDAY  03/01  Beginning at  3:30 a.m. (5 hours before procedure):         1. Every 15 minutes, drink the solution down to the next mark (approx 8 oz) until the full liter is complete.  2. Follow completed prep with 16 oz. of clear liquid of your choice.    3. You may drink clear liquids until 6:30am  (2 HOURS BEFORE PROCEDURE).   MEDICATION INSTRUCTIONS  Unless otherwise instructed, you should take regular prescription medications with a small sip of water   as early as possible the morning  of your procedure.  Diabetic patients - see separate instructions.           OTHER INSTRUCTIONS  You will need a responsible adult at least 51 years of age to accompany you and drive you home.   This person must remain in the waiting room during your procedure.  Wear loose fitting clothing that is easily removed.  Leave jewelry and other valuables at home.  However, you may wish to bring a book to read or  an iPod/MP3 player to listen to music as you wait for your procedure to start.  Remove all body piercing jewelry and leave at home.  Total time from sign-in until discharge is approximately 2-3 hours.  You should go home directly after your procedure and rest.  You can resume normal activities the  day after your procedure.  The day of your procedure you should not:   Drive   Make legal decisions   Operate machinery   Drink alcohol   Return to work  You will receive specific instructions about eating, activities and medications before you leave.    The above instructions have been reviewed and explained to me by   Derek Sites RN   January 16, 2011 4:52 PM    I fully understand and can verbalize these instructions _____________________________ Date _________

## 2011-01-22 NOTE — Letter (Signed)
Summary: Diabetic Instructions   [facility]  [address] [place], [place] [address]   Phone: [phone]  Fax: [phone]    Derek Herrera 1961-06-08 MRN: [id number]     ________________________________________________________________________   INSULIN (LONG ACTING) MEDICATION INSTRUCTIONS                      (Lantus)  The day before your procedure:   Take  your regular evening dose    The day of your procedure:   Do not take your morning dose     INSULIN (SHORT ACTING) MEDICATION INSTRUCTIONS (Novolog)   The day before your procedure:   Do not take your evening dose   The day of your procedure:   Do not take your morning dose

## 2011-01-22 NOTE — Miscellaneous (Signed)
Summary: [facility]  Clinical Lists Changes  Medications: Added new medication of MOVIPREP 100 GM  SOLR (PEG-KCL-NACL-NASULF-NA ASC-C) As per prep instructions. - Signed Rx of MOVIPREP 100 GM  SOLR (PEG-KCL-NACL-NASULF-NA ASC-C) As per prep instructions.;  #1 x 0;  Signed;  Entered by: Ezra Sites RN;  Authorized by: Louis Meckel MD;  Method used: Electronically to [facility]*, [address], [place], [place]  [address], Ph: [phone] or [phone], Fax: [phone] Observations: Added new observation of NKA: T (01/16/2011 16:20)    Prescriptions: MOVIPREP 100 GM  SOLR (PEG-KCL-NACL-NASULF-NA ASC-C) As per prep instructions.  #1 x 0   Entered by:   Ezra Sites RN   Authorized by:   Louis Meckel MD   Signed by:   Ezra Sites RN on 01/16/2011   Method used:   Electronically to        [facility]* (retail)       [address] [place], [place]  [address]       Ph: [phone] or [phone]       Fax: [phone]   RxID:   [id number]

## 2011-01-22 NOTE — Progress Notes (Signed)
Summary: test strips   Phone Note Refill Request Message from:  Fax from Pharmacy on January 16, 2011 8:42 AM  Refills Requested: Medication #1:  ONETOUCH ULTRA TEST   STRP CHECK BLOOD GLUCOSE 5 per day   Dosage confirmed as above?Dosage Confirmed   Last Refilled: 12/19/2010  Method Requested: Electronic Next Appointment Scheduled: 02/12/2011 Initial call taken by: Brenton Grills CMA (AAMA),  January 16, 2011 8:43 AM    Prescriptions: Letta Pate ULTRA TEST   STRP (GLUCOSE BLOOD) CHECK BLOOD GLUCOSE 5 per day, 250.01, and lancets  #150 x 5   Entered by:   Brenton Grills CMA (AAMA)   Authorized by:   Minus Breeding MD   Signed by:   Brenton Grills CMA (AAMA) on 01/16/2011   Method used:   Electronically to        [facility]* (retail)       [address] [place], [place]  [address]       Ph: [phone] or [phone]       Fax: [phone]   RxID:   [id number]

## 2011-01-22 NOTE — Progress Notes (Signed)
Summary: rx refill req  Phone Note Refill Request Message from:  Fax from Pharmacy on January 16, 2011 8:43 AM  Refills Requested: Medication #1:  ZOCOR 80 MG  TABS take 1 at bedtime   Dosage confirmed as above?Dosage Confirmed   Last Refilled: 12/14/2010  Method Requested: Electronic Next Appointment Scheduled: 01/14/2011 Initial call taken by: Brenton Grills CMA (AAMA),  January 16, 2011 8:44 AM    Prescriptions: ZOCOR 80 MG  TABS (SIMVASTATIN) take 1 at bedtime  #30 x 5   Entered by:   Brenton Grills CMA (AAMA)   Authorized by:   Minus Breeding MD   Signed by:   Brenton Grills CMA (AAMA) on 01/16/2011   Method used:   Electronically to        [facility]* (retail)       [address] [place], [place]  [address]       Ph: [phone] or [phone]       Fax: [phone]   RxID:   [id number]

## 2011-01-29 ENCOUNTER — Telehealth: Payer: Self-pay | Admitting: Gastroenterology

## 2011-01-30 ENCOUNTER — Other Ambulatory Visit: Payer: Self-pay | Admitting: Gastroenterology

## 2011-01-30 ENCOUNTER — Other Ambulatory Visit (AMBULATORY_SURGERY_CENTER): Payer: PRIVATE HEALTH INSURANCE | Admitting: Gastroenterology

## 2011-01-30 DIAGNOSIS — D126 Benign neoplasm of colon, unspecified: Secondary | ICD-10-CM

## 2011-01-30 DIAGNOSIS — K648 Other hemorrhoids: Secondary | ICD-10-CM

## 2011-01-30 DIAGNOSIS — Z1211 Encounter for screening for malignant neoplasm of colon: Secondary | ICD-10-CM

## 2011-01-30 LAB — GLUCOSE, CAPILLARY: Glucose-Capillary: 206 mg/dL — ABNORMAL HIGH (ref 70–99)

## 2011-02-06 ENCOUNTER — Encounter: Payer: Self-pay | Admitting: Gastroenterology

## 2011-02-06 NOTE — Progress Notes (Signed)
Summary: Triage   Phone Note Call from Patient Call back at [phone]   Caller: Patient Call For: Dr. Arlyce Dice Reason for Call: Talk to Nurse Summary of Call: pt. has a COL sch'd for tomorrow and ate some eggs this morning and some rice for lunch. Initial call taken by: Karna Christmas,  January 29, 2011 2:24 PM  Follow-up for Phone Call        Pt states that he ate eggs for breakfast and rice and  a salmon patty for lunch.  Wants to know if he can proceed with his prep for procedure tomorrow.  Advised him to go ahead with prep and to drink plenty of fluids.  Pt. verbalized understanding. Follow-up by: Jennye Boroughs RN,  January 29, 2011 3:57 PM

## 2011-02-06 NOTE — Procedures (Addendum)
Summary: Colonoscopy  Patient: Derek Herrera Note: All result statuses are Final unless otherwise noted.  Tests: (1) Colonoscopy (COL)   COL Colonoscopy           DONE     [facility]     [address]     [place], [place]  [address]          COLONOSCOPY PROCEDURE REPORT          PATIENT:  [name], [name]  MR#:  [id number]     BIRTHDATE:  Feb 05, 1961, 49 yrs. old  GENDER:  male          ENDOSCOPIST:  Barbette Hair. Arlyce Dice, MD     Referred by:  Birdie Sons, M.D.          PROCEDURE DATE:  01/30/2011     PROCEDURE:  Colonoscopy with snare polypectomy     ASA CLASS:  Class II     INDICATIONS:  1) Routine Risk Screening          MEDICATIONS:   Fentanyl 50 mcg IV, Versed 7 mg IV          DESCRIPTION OF PROCEDURE:   After the risks benefits and     alternatives of the procedure were thoroughly explained, informed     consent was obtained.  Digital rectal exam was performed and     revealed no abnormalities.   The LB 180AL [id number] endoscope was     introduced through the anus and advanced to the cecum, which was     identified by both the appendix and ileocecal valve, without     limitations.  The quality of the prep was good, using MoviPrep.     The instrument was then slowly withdrawn as the colon was fully     examined.     <<PROCEDUREIMAGES>>          FINDINGS:  A sessile polyp was found in the descending colon. It     was 6 mm in size. Polyp was snared without cautery. Retrieval was     successful (see image9). snare polyp  Internal Hemorrhoids were     found (see image15).  This was otherwise a normal examination of     the colon (see image3, image5, image13, and image14).     Retroflexed views in the rectum revealed no abnormalities.    The     time to cecum =  5.50  minutes. The scope was then withdrawn (time     =  10.50  min) from the patient and the procedure completed.          COMPLICATIONS:  None          ENDOSCOPIC IMPRESSION:     1) 6 mm sessile polyp  in the descending colon     2) Internal hemorrhoids     3) Otherwise normal examination     RECOMMENDATIONS:     1) If the polyp(s) removed today are proven to be adenomatous     (pre-cancerous) polyps, you will need a repeat colonoscopy in 5     years. Otherwise you should continue to follow colorectal cancer     screening guidelines for "routine risk" patients with colonoscopy     in 10 years.          REPEAT EXAM:   You will receive a letter from Dr. Arlyce Dice in 1-2     weeks, after reviewing the final pathology, with followup  recommendations.          ______________________________     Barbette Hair Arlyce Dice, MD          CC:          n.     eSIGNED:   Barbette Hair. Sindee Stucker at 01/30/2011 09:17 AM          [name], [id number]  Note: An exclamation mark (!) indicates a result that was not dispersed into the flowsheet. Document Creation Date: 01/30/2011 9:17 AM _______________________________________________________________________  (1) Order result status: Final Collection or observation date-time: 01/30/2011 09:09 Requested date-time:  Receipt date-time:  Reported date-time:  Referring Physician:   Ordering Physician: Melvia Heaps [phone]) Specimen Source:  Source: Launa Grill Order Number: [id number] Lab site:   Appended Document: Colonoscopy     Procedures Next Due Date:    Colonoscopy: 01/2016

## 2011-02-11 NOTE — Letter (Signed)
Summary: Patient Notice- Polyp Results  [facility]  [address] [place], [place] [address]   Phone: [phone]  Fax: [phone]        February 06, 2011 MRN: [id number]    [facility] [address] [place], [place]  [address]    Dear Mr. Rickenbach,  I am pleased to inform you that the colon polyp(s) removed during your recent colonoscopy was (were) found to be benign (no cancer detected) upon pathologic examination.  I recommend you have a repeat colonoscopy examination in _5 years to look for recurrent polyps, as having colon polyps increases your risk for having recurrent polyps or even colon cancer in the future.  Should you develop new or worsening symptoms of abdominal pain, bowel habit changes or bleeding from the rectum or bowels, please schedule an evaluation with either your primary care physician or with me.  Additional information/recommendations:  __ No further action with gastroenterology is needed at this time. Please      follow-up with your primary care physician for your other healthcare      needs.  __ Please call [phone] to schedule a return visit to review your      situation.  __ Please keep your follow-up visit as already scheduled.  _x_ Continue treatment plan as outlined the day of your exam.  Please call us if you are having persistent problems or have questions about your condition that have not been fully answered at this time.  Sincerely,  Louis Meckel MD  This letter has been electronically signed by your physician.  Appended Document: Patient Notice- Polyp Results letter mailed

## 2011-02-12 ENCOUNTER — Ambulatory Visit: Payer: Self-pay | Admitting: Endocrinology

## 2011-02-27 ENCOUNTER — Ambulatory Visit (INDEPENDENT_AMBULATORY_CARE_PROVIDER_SITE_OTHER): Payer: PRIVATE HEALTH INSURANCE | Admitting: Endocrinology

## 2011-02-27 ENCOUNTER — Encounter: Payer: Self-pay | Admitting: Endocrinology

## 2011-02-27 ENCOUNTER — Ambulatory Visit: Payer: Self-pay | Admitting: Endocrinology

## 2011-02-27 ENCOUNTER — Other Ambulatory Visit (INDEPENDENT_AMBULATORY_CARE_PROVIDER_SITE_OTHER): Payer: PRIVATE HEALTH INSURANCE

## 2011-02-27 VITALS — BP 110/82 | HR 94 | Temp 98.8°F | Ht 76.0 in | Wt 306.8 lb

## 2011-02-27 DIAGNOSIS — E119 Type 2 diabetes mellitus without complications: Secondary | ICD-10-CM

## 2011-02-27 DIAGNOSIS — E109 Type 1 diabetes mellitus without complications: Secondary | ICD-10-CM

## 2011-02-27 NOTE — Progress Notes (Signed)
  Subjective:    Patient ID: Derek Herrera, male    DOB: 22-May-1961, 50 y.o.   MRN: [id number]  HPI pt states he feels well in general, except for fatigue.  He has lost a few lbs, due to his efforts.  no cbg record, but states cbg's are highest in am (higher than at hs). It is lowest in the afternoon, but he says this depends on his diet.    Review of Systems Denies hypoglycemia    Objective:   Physical Exam GENERAL: no distress.  Obese.       Assessment & Plan:  Dm, uncertain control.  He may need more lantus.

## 2011-02-27 NOTE — Patient Instructions (Addendum)
blood tests are being ordered for you today.  please call [phone] to hear your test results. pending the test results, please continue lantus 80 units at bedtime, and novolog (just before each meal) 45-0-85 units Please schedule a follow-up appointment in 3 months. check your blood sugar 2 times a day.  vary the time of day when you check, between before the 3 meals, and at bedtime.  also check if you have symptoms of your blood sugar being too high or too low.  please keep a record of the readings and bring it to your next appointment here.  please call us sooner if you are having low blood sugar episodes. good diet and exercise habits significanly improve the control of your diabetes.  please let me know if you wish to be referred to a dietician.  high blood sugar is very risky to your health.  you should see an eye doctor every year. controlling your blood pressure and cholesterol drastically reduces the damage diabetes does to your body.  this also applies to quitting smoking.  please discuss these with your doctor.  you should take an aspirin every day, unless you have been advised by a doctor not to.

## 2011-02-27 NOTE — Progress Notes (Signed)
  Subjective:    Patient ID: Derek Herrera, male    DOB: [birth year], 50 y.o.   MRN: [id number]  HPI    Review of Systems     Objective:   Physical Exam Neck:  Thyroid is not enlarged       Assessment & Plan:

## 2011-04-15 NOTE — Consult Note (Signed)
[facility]                          ENDOCRINOLOGY CONSULTATION   NAME:Derek Herrera, Derek Herrera                     MRN:          [id number]  DATE:04/14/2007                            DOB:          Jan 10, 1961    REASON FOR VISIT:  Follow up diabetes.   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  They are below 100 in the afternoon if he  is active.  His glucoses are often above 200 at h.s. and in the morning.   PAST MEDICAL HISTORY:  1. Diabetes nephropathy.  2. Hypertension.  3. Dyslipidemia.   REVIEW OF SYSTEMS:  Denies hypoglycemia.   PHYSICAL EXAMINATION:  VITAL SIGNS:  Blood pressure 130/85, heart rate  92, temperature 98.4, weight 288.  GENERAL:  No distress.  Alert, well oriented.  EXTREMITIES:  No edema.   LABORATORY DATA:  Hemoglobin A1C 7.7.   IMPRESSION:  Significant improvement in glucose with adjustments in  therapy.   PLAN:  1. Change q.a.c. NovoLog to 30 at breakfast, 15 at lunch and 50 at      supper.  He was also advised to take 15 at h.s. if he eats a snack      then, but I have told him there is no medical reason to eat a      snack.  2. He should skip his lunch insulin if activity is expected in the      afternoon.  3. Increase Lantus to 90 units q.h.s.  4. Return in about 3 months.     Sean A. Everardo All, MD  Electronically Signed    SAE/MedQ  DD: 04/18/2007  DT: 04/19/2007  Job #: [id number]   cc:   Valetta Mole. Cato Mulligan, MD  Garnetta Buddy, M.D.

## 2011-04-18 NOTE — Consult Note (Signed)
[facility]                            ENDOCRINOLOGY CONSULTATION   NAME:Derek Herrera, Derek Herrera                     MRN:          [id number]  DATE:07/13/2006                            DOB:          1961-05-12    REFERRING PHYSICIAN:  Valetta Mole. Swords, M.D.   REASON FOR REFERRAL:  Diabetes.   HISTORY OF PRESENT ILLNESS:  A 50 year old woman who states she was  diagnosed with diabetes in 1996.  After failure of oral agents, she went on  insulin about 3 months ago and currently takes Lantus 48 units a day and NPH  insulin 20 units t.i.d. (q.a.c.).  Symptomatically, she has several years of  numbness of the feet and slight associated blurry vision.  She states her  diet and exercise regimen are both poor, and she states the aggressive  insulin regimen prescribed to her by Dr. Cato Mulligan is being compromised by her  habit of snacking frequently throughout the day.   PAST MEDICAL HISTORY:  1. Hypertension.  2. Dyslipidemia.   SOCIAL HISTORY:  She is married.  She works in the Furniture conservator/restorer  business.   FAMILY HISTORY:  Negative for diabetes in her immediate family.   REVIEW OF SYSTEMS:  She has chronic noncardiogenic chest pain.  She denies  hypoglycemia.   PHYSICAL EXAMINATION:  VITAL SIGNS:  Blood pressure 147/96, heart rate 74,  temperature is 97.7, the weight is 261.  GENERAL:  Appears healthy except for obesity.  SKIN:  Not diaphoretic.  I do not see a rash.  HEENT:  No proptosis.  No periorbital swelling.  Pharynx:  No erythema.  NECK:  No goiter.  CHEST:  Clear to auscultation.  No respiratory distress.  CARDIOVASCULAR:  No JVD.  There is trace bilateral pretibial edema.  Regular  rate and rhythm.  No murmur.  Pedal pulses are intact and there is no bruit  at the carotid arteries.  EXTREMITIES:  Feet normal color and temperature.  There is no ulcer present  on the feet.  NEUROLOGIC:  Alert, oriented, does not appear anxious nor  depressed.   LABORATORY STUDIES:  Forwarded by Dr. Cato Mulligan:  On February 02, 2006, hemoglobin  A1c 11.1.  Urine micro albumin is significantly elevated.  Home glucose is  recorded as extremely variable with no particular pattern.   IMPRESSION:  1. Her diabetes control appears to be poor owing to her poor dietary      habits.  2. Numbness of the feet, uncertain etiology.  3. Diabetes nephropathy.  4. She appears to need more aggressive therapy for her hypertension.  5. Blurry vision.   PLAN:  1. See Dr. Cato Mulligan to followup hypertension.  2. See an ophthalmologist for your diabetes followup and to evaluate your      blurry vision.  3. We discussed the risk of diabetes.  4. We discussed the importance of matching the insulin level in her body      to her requirements, as best we can.  So, she will try to minimize her      snacks and increase her Lantus to 50 units  a day and will increase her      NovoLog to 25 units q.a.c. (t.i.d.) as well as 10 units at bedtime if      you eat a snack then.  5. Return in 2 weeks.                                   Sean A. Everardo All, MD   SAE/MedQ  DD:  07/14/2006  DT:  07/14/2006  Job #:  [id number]   cc:   Valetta Mole. Swords, MD

## 2011-05-29 ENCOUNTER — Ambulatory Visit: Payer: PRIVATE HEALTH INSURANCE | Admitting: Endocrinology

## 2011-06-02 ENCOUNTER — Other Ambulatory Visit: Payer: Self-pay | Admitting: *Deleted

## 2011-06-02 MED ORDER — LISINOPRIL 20 MG PO TABS: 20.0000 mg | ORAL_TABLET | Freq: Every day | ORAL | Status: DC

## 2011-06-02 NOTE — Telephone Encounter (Signed)
R'cd fax from [facility] for refill of Lisinopril  Last OV-02/27/2011  Last Filled-04/29/2011

## 2011-06-05 ENCOUNTER — Other Ambulatory Visit (INDEPENDENT_AMBULATORY_CARE_PROVIDER_SITE_OTHER): Payer: PRIVATE HEALTH INSURANCE

## 2011-06-05 ENCOUNTER — Ambulatory Visit (INDEPENDENT_AMBULATORY_CARE_PROVIDER_SITE_OTHER): Payer: PRIVATE HEALTH INSURANCE | Admitting: Endocrinology

## 2011-06-05 ENCOUNTER — Encounter: Payer: Self-pay | Admitting: Endocrinology

## 2011-06-05 VITALS — BP 122/80 | HR 98 | Temp 99.6°F | Ht 76.0 in | Wt 305.0 lb

## 2011-06-05 DIAGNOSIS — E109 Type 1 diabetes mellitus without complications: Secondary | ICD-10-CM

## 2011-06-05 NOTE — Patient Instructions (Addendum)
blood tests are being ordered for you today.  please call [phone] to hear your test results.  You will be prompted to enter the 9-digit "MRN" number that appears at the top left of this page, followed by #.  Then you will hear the message.  pending the test results, please continue lantus 80 units at bedtime, and novolog (just before each meal) 45-0-85 units.  There is no medical reason to eat a snack at bedtime.  However, if you choise to eat at bedtime, take 15 units of novolog with it.  Please schedule a follow-up appointment in 3 months.  check your blood sugar 2 times a day.  vary the time of day when you check, between before the 3 meals, and at bedtime.  also check if you have symptoms of your blood sugar being too high or too low.  please keep a record of the readings and bring it to your next appointment here.  please call us sooner if you are having low blood sugar episodes.  (update: i left message on phone-tree:  Increase supper novolog to 95 units).

## 2011-06-05 NOTE — Progress Notes (Signed)
  Subjective:    Patient ID: Derek Herrera, male    DOB: 01-23-1961, 50 y.o.   MRN: [id number]  HPI pt states he feels well in general.  no cbg record, but states cbg's are highest in am (higher than at hs, but he sometimes eats at hs).  He says it is not high in am if he does not eat at hs.   Past Medical History  Diagnosis Date  . DIABETES MELLITUS, TYPE I 06/29/2007  . HYPERTENSION 06/29/2007  . HYPERCHOLESTEROLEMIA 05/20/2010  . DM nephropathy/sclerosis     Past Surgical History  Procedure Date  . Knee surgery     x's 3    History   Social History  . Marital Status: Married    Spouse Name: N/A    Number of Children: 5  . Years of Education: N/A   Occupational History  . 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 Smoking status: Never Smoker   . Smokeless tobacco: Not on file  . Alcohol Use: No  . Drug Use: No  . Sexually Active:    Other Topics Concern  . Not on file   Social History Narrative  . No narrative on file    Current Outpatient Prescriptions on File Prior to Visit  Medication Sig Dispense Refill  . glucose blood (ONE TOUCH ULTRA TEST) test strip 1 each by Other route 5 (five) times daily. Use as instructed       . insulin aspart (NOVOLOG FLEXPEN) 100 UNIT/ML injection Inject into the skin 3 (three) times daily before meals. Take 45 units with breakfast, and 85 units with the evening meal       . insulin glargine (LANTUS) 100 UNIT/ML injection Inject 80 Units into the skin at bedtime.        . Insulin Pen Needle (B-D ULTRAFINE III SHORT PEN) 31G X 8 MM MISC 2 (two) times daily.       Marland Kitchen lisinopril (PRINIVIL,ZESTRIL) 20 MG tablet Take 1 tablet (20 mg total) by mouth daily.  30 tablet  4  . simvastatin (ZOCOR) 80 MG tablet Take 80 mg by mouth at bedtime.          No Known Allergies  Family History  Problem Relation Age of Onset  . Diabetes Maternal Grandmother    BP 122/80  Pulse 98  Temp(Src) 99.6 F (37.6 C) (Oral)  Ht  6\' 4"  (1.93 m)  Wt 305 lb (138.347 kg)  BMI 37.13 kg/m2  SpO2 96% Review of Systems denies hypoglycemia    Objective:   Physical Exam Pulses: dorsalis pedis intact bilat.   Feet: no deformity.  no ulcer on the feet.  feet are of normal color and temp.  1+ bilat leg edema Neuro: sensation is intact to touch on the feet.     Lab Results  Component Value Date   HGBA1C 8.3* 06/05/2011   Assessment & Plan:  Dm, needs increased rx

## 2011-06-20 ENCOUNTER — Other Ambulatory Visit: Payer: Self-pay | Admitting: *Deleted

## 2011-06-20 MED ORDER — GLUCOSE BLOOD VI STRP: ORAL_STRIP | Status: DC

## 2011-06-20 NOTE — Telephone Encounter (Signed)
R'cd fax from [facility] for refill of Test strips  Last OV-06/05/2011  Last filled-05/27/2011

## 2011-07-31 ENCOUNTER — Inpatient Hospital Stay (HOSPITAL_COMMUNITY)
Admission: EM | Admit: 2011-07-31 | Discharge: 2011-08-03 | DRG: 310 | Disposition: A | Discharge status: Home / Self Care | Payer: PRIVATE HEALTH INSURANCE | Attending: Internal Medicine | Admitting: Internal Medicine

## 2011-07-31 ENCOUNTER — Emergency Department (HOSPITAL_COMMUNITY): Payer: PRIVATE HEALTH INSURANCE

## 2011-07-31 ENCOUNTER — Ambulatory Visit (INDEPENDENT_AMBULATORY_CARE_PROVIDER_SITE_OTHER): Payer: PRIVATE HEALTH INSURANCE | Admitting: Internal Medicine

## 2011-07-31 ENCOUNTER — Encounter: Payer: Self-pay | Admitting: Internal Medicine

## 2011-07-31 DIAGNOSIS — I10 Essential (primary) hypertension: Secondary | ICD-10-CM

## 2011-07-31 DIAGNOSIS — I4891 Unspecified atrial fibrillation: Secondary | ICD-10-CM

## 2011-07-31 DIAGNOSIS — R000 Tachycardia, unspecified: Secondary | ICD-10-CM

## 2011-07-31 DIAGNOSIS — E7800 Pure hypercholesterolemia, unspecified: Secondary | ICD-10-CM | POA: Diagnosis present

## 2011-07-31 DIAGNOSIS — Z794 Long term (current) use of insulin: Secondary | ICD-10-CM

## 2011-07-31 DIAGNOSIS — Z7901 Long term (current) use of anticoagulants: Secondary | ICD-10-CM

## 2011-07-31 DIAGNOSIS — E119 Type 2 diabetes mellitus without complications: Secondary | ICD-10-CM | POA: Diagnosis present

## 2011-07-31 DIAGNOSIS — G4733 Obstructive sleep apnea (adult) (pediatric): Secondary | ICD-10-CM | POA: Diagnosis present

## 2011-07-31 DIAGNOSIS — J40 Bronchitis, not specified as acute or chronic: Secondary | ICD-10-CM

## 2011-07-31 DIAGNOSIS — R079 Chest pain, unspecified: Secondary | ICD-10-CM

## 2011-07-31 DIAGNOSIS — R0602 Shortness of breath: Secondary | ICD-10-CM

## 2011-07-31 DIAGNOSIS — E785 Hyperlipidemia, unspecified: Secondary | ICD-10-CM | POA: Diagnosis present

## 2011-07-31 DIAGNOSIS — Z833 Family history of diabetes mellitus: Secondary | ICD-10-CM

## 2011-07-31 DIAGNOSIS — E109 Type 1 diabetes mellitus without complications: Secondary | ICD-10-CM

## 2011-07-31 DIAGNOSIS — Z79899 Other long term (current) drug therapy: Secondary | ICD-10-CM

## 2011-07-31 DIAGNOSIS — J209 Acute bronchitis, unspecified: Secondary | ICD-10-CM | POA: Diagnosis present

## 2011-07-31 DIAGNOSIS — E669 Obesity, unspecified: Secondary | ICD-10-CM | POA: Diagnosis present

## 2011-07-31 DIAGNOSIS — I4892 Unspecified atrial flutter: Secondary | ICD-10-CM

## 2011-07-31 DIAGNOSIS — F1011 Alcohol abuse, in remission: Secondary | ICD-10-CM | POA: Diagnosis present

## 2011-07-31 LAB — COMPREHENSIVE METABOLIC PANEL
Albumin: 3.3 g/dL — ABNORMAL LOW (ref 3.5–5.2)
Alkaline Phosphatase: 86 U/L (ref 39–117)
BUN: 12 mg/dL (ref 6–23)
CO2: 30 mEq/L (ref 19–32)
Chloride: 98 mEq/L (ref 96–112)
GFR calc Af Amer: >60 mL/min (ref >60)
Glucose, Bld: 82 mg/dL (ref 70–99)
Potassium: 4.1 mEq/L (ref 3.5–5.1)
Total Bilirubin: 0.5 mg/dL (ref 0.3–1.2)

## 2011-07-31 LAB — GLUCOSE, CAPILLARY: Glucose-Capillary: 213 mg/dL — ABNORMAL HIGH (ref 70–99)

## 2011-07-31 LAB — CBC
HCT: 41.2 % (ref 39.0–52.0)
Hemoglobin: 13.8 g/dL (ref 13.0–17.0)
WBC: 11.2 10*3/uL — ABNORMAL HIGH (ref 4.0–10.5)

## 2011-07-31 LAB — DIFFERENTIAL
Basophils Absolute: 0.1 10*3/uL (ref 0.0–0.1)
Lymphocytes Relative: 18 % (ref 12–46)
Neutro Abs: 7.3 10*3/uL (ref 1.7–7.7)

## 2011-07-31 LAB — POCT I-STAT TROPONIN I: Troponin i, poc: 0 ng/mL (ref 0.00–0.08)

## 2011-07-31 NOTE — Progress Notes (Signed)
  Subjective:    Patient ID: Derek Herrera, male    DOB: Apr 03, 1961, 50 y.o.   MRN: [id number]  HPI  50 year old patient has a history of hypertension and diabetes. He has been ill for the past 5 days with chest congestion cough and fatigue. He notes a mild fever and chills last week and it has improved he is in the coughing considerably with sputum production of yellow secretions. He developed chest and epigastric pain related to the coughing he has noted poor appetite and general sense of unwellness. He is a nonsmoker.  Past Medical History  Diagnosis Date  . DIABETES MELLITUS, TYPE I 06/29/2007  . HYPERTENSION 06/29/2007  . HYPERCHOLESTEROLEMIA 05/20/2010  . DM nephropathy/sclerosis    Past Surgical History  Procedure Date  . Knee surgery     x's 3    reports that he has never smoked. He does not have any smokeless tobacco history on file. He reports that he does not drink alcohol or use illicit drugs. family history includes Diabetes in his maternal grandmother. No Known Allergies     Review of Systems  Constitutional: Positive for fever, chills, activity change, appetite change and fatigue.  HENT: Negative for hearing loss, ear pain, congestion, sore throat, trouble swallowing, neck stiffness, dental problem, voice change and tinnitus.   Eyes: Negative for pain, discharge and visual disturbance.  Respiratory: Positive for cough. Negative for chest tightness, shortness of breath, wheezing and stridor.   Cardiovascular: Negative for chest pain, palpitations and leg swelling.  Gastrointestinal: Negative for nausea, vomiting, abdominal pain, diarrhea, constipation, blood in stool and abdominal distention.  Genitourinary: Negative for urgency, hematuria, flank pain, discharge, difficulty urinating and genital sores.  Musculoskeletal: Negative for myalgias, back pain, joint swelling, arthralgias and gait problem.  Skin: Negative for rash.  Neurological: Negative for dizziness,  syncope, speech difficulty, weakness, numbness and headaches.  Hematological: Negative for adenopathy. Does not bruise/bleed easily.  Psychiatric/Behavioral: Negative for behavioral problems and dysphoric mood. The patient is not nervous/anxious.        Objective:   Physical Exam  Constitutional: He is oriented to person, place, and time. He appears well-developed and well-nourished. No distress.       Overweight. Nontoxic. No distress blood pressure 120/82  Pulse rate 140 O2 saturation 96-97%  HENT:  Head: Normocephalic.  Right Ear: External ear normal.  Left Ear: External ear normal.  Eyes: Conjunctivae and EOM are normal.  Neck: Normal range of motion.  Cardiovascular: Normal heart sounds.        Rapid regular tachycardia at a rate of 140-150  Pulmonary/Chest: Effort normal. No respiratory distress.       Coarse bilateral rhonchi left greater than the right  Abdominal: Bowel sounds are normal.  Musculoskeletal: Normal range of motion. He exhibits no edema and no tenderness.  Neurological: He is alert and oriented to person, place, and time.  Psychiatric: He has a normal mood and affect. His behavior is normal.          Assessment & Plan:   Bronchitis rule out pneumonia Tachycardia rule out SVT  We'll check a twelve-lead EKG. Will require ED evaluation and possible hospitalization for further evaluation and treatment

## 2011-07-31 NOTE — Patient Instructions (Signed)
Report to  [facility] emergency department immediately for further evaluation and treatment of your heart rhythm. You'll require a chest x-ray and possibly hospital admission

## 2011-08-01 DIAGNOSIS — I4891 Unspecified atrial fibrillation: Secondary | ICD-10-CM

## 2011-08-01 LAB — GLUCOSE, CAPILLARY: Glucose-Capillary: 206 mg/dL — ABNORMAL HIGH (ref 70–99)

## 2011-08-01 LAB — BASIC METABOLIC PANEL
CO2: 28 mEq/L (ref 19–32)
Chloride: 100 mEq/L (ref 96–112)
Creatinine, Ser: 0.59 mg/dL (ref 0.50–1.35)
Potassium: 4.2 mEq/L (ref 3.5–5.1)

## 2011-08-01 LAB — TSH: TSH: 2.925 u[IU]/mL (ref 0.350–4.500)

## 2011-08-01 LAB — CBC
HCT: 40 % (ref 39.0–52.0)
MCV: 82.6 fL (ref 78.0–100.0)
Platelets: 282 10*3/uL (ref 150–400)
RBC: 4.84 MIL/uL (ref 4.22–5.81)
WBC: 9.3 10*3/uL (ref 4.0–10.5)

## 2011-08-01 LAB — LIPID PANEL
Cholesterol: 169 mg/dL (ref 0–200)
HDL: 26 mg/dL — ABNORMAL LOW (ref >39)
Total CHOL/HDL Ratio: 6.5 RATIO
VLDL: 22 mg/dL (ref 0–40)

## 2011-08-02 DIAGNOSIS — I4892 Unspecified atrial flutter: Secondary | ICD-10-CM

## 2011-08-02 HISTORY — PX: TEE WITH CARDIOVERSION: SHX5442

## 2011-08-02 LAB — GLUCOSE, CAPILLARY
Glucose-Capillary: 154 mg/dL — ABNORMAL HIGH (ref 70–99)
Glucose-Capillary: 180 mg/dL — ABNORMAL HIGH (ref 70–99)

## 2011-08-03 LAB — CBC
HCT: 40.3 % (ref 39.0–52.0)
Hemoglobin: 12.9 g/dL — ABNORMAL LOW (ref 13.0–17.0)
MCV: 83.8 fL (ref 78.0–100.0)
RBC: 4.81 MIL/uL (ref 4.22–5.81)
WBC: 9.3 10*3/uL (ref 4.0–10.5)

## 2011-08-03 LAB — BASIC METABOLIC PANEL
BUN: 12 mg/dL (ref 6–23)
CO2: 31 mEq/L (ref 19–32)
Chloride: 102 mEq/L (ref 96–112)
Creatinine, Ser: 0.67 mg/dL (ref 0.50–1.35)
Glucose, Bld: 124 mg/dL — ABNORMAL HIGH (ref 70–99)
Potassium: 4.6 mEq/L (ref 3.5–5.1)

## 2011-08-03 LAB — GLUCOSE, CAPILLARY: Glucose-Capillary: 129 mg/dL — ABNORMAL HIGH (ref 70–99)

## 2011-08-07 NOTE — H&P (Signed)
NAME:  Derek Herrera NO.:  [id number]  MEDICAL RECORD NO.:  [id number]  LOCATION:  2927                         FACILITY:  [facility]  PHYSICIAN:  Hillis Range, MD       DATE OF BIRTH:  1961/11/25  DATE OF ADMISSION:  07/31/2011 DATE OF DISCHARGE:                             HISTORY & PHYSICAL   CHIEF COMPLAINT:  Cough and shortness of breath.  HISTORY OF PRESENT ILLNESS:  [name] is a pleasant 50 year old gentleman with a history of obesity, diabetes, and hypertension who was admitted with atrial fibrillation with rapid ventricular rates.  He reports that since last Friday, he has had a cough, productive of a clear sputum.  He has reported associated shortness of breath.  His shortness of breath and cough were much worse when lying supine.  He reports mild subjective fevers.  He denies chest pain, palpitations, dizziness, presyncope, syncope, or edema.  He presented to Urgent Care and was placed initially on antibiotics without significant improvement. He reports subsequent improvement over the last 1-2 days.  He presented to Dr. Vernon Prey office earlier today for further evaluation and was found to have atrial fibrillation with a rapid ventricular response.  He was therefore referred to [facility] ER for further evaluation. Presently, he continues to have a cough and feels that his shortness of breath is persistent, but improved.  He denies chest pain and is otherwise without complaint.  PAST MEDICAL HISTORY: 1. Obesity. 2. Diabetes. 3. Hypertension. 4. Hyperlipidemia. 5. Diabetic nephropathy.  PAST SURGICAL HISTORY:  Status post right arm surgery and knee surgery.  MEDICATIONS:  Reviewed in the [facility].  ALLERGIES:  No known drug allergies.  SOCIAL HISTORY:  The patient lives in [place].  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  He denies tobacco.  He has a remote heavy history of alcohol, but quit for many years.  FAMILY HISTORY:  Notable for  diabetes.  REVIEW OF SYSTEMS:  All systems were reviewed and negative except as outlined in the HPI above.  PHYSICAL EXAMINATION:  Telemetry reveals atrial fibrillation with ventricular rates in the 120s. VITAL SIGNS:  Blood pressure 117/68, heart rate 133, respirations 29, sats 96% on room air, afebrile. GENERAL:  The patient is an obese, but well-appearing male in no acute distress.  He is alert and oriented x3. HEENT:  Normocephalic and atraumatic.  Sclerae are clear.  Conjunctivae pink.  Oropharynx clear. NECK:  Supple.  JVP 10 cm. LUNGS:  Few bibasilar rales, otherwise clear. HEART:  Tachycardic, irregular rhythm.  Decreased heart sounds.  No appreciable murmurs. GI:  Soft, nontender, and nondistended.  Positive bowel sounds. EXTREMITIES:  No clubbing, cyanosis or edema. SKIN:  No ecchymoses or lacerations. MUSCULOSKELETAL:  No deformity or atrophy. PSYCHIATRY:  Euthymic mood, full affect.  EKG today reveals atrial fibrillation with a ventricular rate of 101 beats per minute otherwise unremarkable.  Chest x-ray; I have personally reviewed the patient's chest x-ray today which reveals mild cardiomegaly and mild edema with no other airspace disease appreciated.  LABORATORY DATA:  Potassium 4.1, creatinine 0.66.  LFTs normal except that the albumin is 3.3.  White blood cell count 11, hematocrit  41, platelets 288, troponin 0.  IMPRESSION:  [name] is a pleasant 50 year old gentleman with a history of hypertension and diabetes who now is admitted with atrial fibrillation with a rapid ventricular rate.  The duration of his atrial fibrillation is unknown.  It is also not clear to me that the atrial fibrillation is directly responsible for his symptoms of cough and shortness of breath.  He has been diagnosed with bronchitis and treated with antibiotics.  I am suspicious that his symptoms are also likely due to acute congestive heart failure.  We will plan to admit the patient  to the Step-Down Unit for close observation and management.  He was placed on a diltiazem drip for heart rate control.  Also, we will place the patient on Xarelto for stroke prevention.  Once his heart rates have been adequately controlled, we will then convert him to oral diltiazem.  An echocardiogram is presently pending.  We will gently diurese the patient and continue his present antibiotics.  I will check a thyroid profile and then determine further management depending on the results of the above.  If he does not spontaneously convert to sinus rhythm, he may require elective cardioversion at some point.     Hillis Range, MD     JA/MEDQ  D:  07/31/2011  T:  07/31/2011  Job:  [id number]  cc:   Gordy Savers, MD  Electronically Signed by Hillis Range MD on 08/07/2011 08:54:20 AM

## 2011-08-20 NOTE — Cardiovascular Report (Signed)
  NAME:  [name], [name] NO.:  [id number]  MEDICAL RECORD NO.:  [id number]  LOCATION:  2927                         FACILITY:  [facility]  PHYSICIAN:  Bevelyn Buckles. Walaa Carel, MDDATE OF BIRTH:  October 23, 1961  DATE OF PROCEDURE:  08/02/2011 DATE OF DISCHARGE:                           CARDIAC CATHETERIZATION   PATIENT IDENTIFICATION:  Derek Herrera is a 50 year old male who was admitted with new-onset atrial flutter.  He had suboptimal rate control with IV Cardizem.  We thus decided to undertake TEE guided cardioversion.  TEE was performed, it showed an EF of approximately 50-55%.  There was no left atrial appendage or thrombus.  After further sedation by Anesthesia, he received a single 150 joules biphasic synchronized shock with the pads in the AP position with prompt conversion to sinus rhythm.  There were no apparent complications.     Bevelyn Buckles. Chen Holzman, MD     DRB/MEDQ  D:  08/02/2011  T:  08/02/2011  Job:  [id number] Electronically Signed by Arvilla Meres MD on 08/20/2011 10:36:29 PM

## 2011-08-20 NOTE — Discharge Summary (Signed)
  NAME:  Derek Herrera, Derek Herrera NO.:  [id number]  MEDICAL RECORD NO.:  [id number]  LOCATION:  2927                         FACILITY:  [facility]  PHYSICIAN:  Derek Herrera, MDDATE OF BIRTH:  11/11/1961  DATE OF ADMISSION:  07/31/2011 DATE OF DISCHARGE:  08/03/2011                              DISCHARGE SUMMARY   PRIMARY CARDIOLOGIST:  Derek Range, MD (new).  DISCHARGE DIAGNOSES: 1. Paroxysmal atrial fibrillation with rapid ventricular response.     a.     New onset.     b.     Status post successful TEE - DC cardioversion.     c.     [place] score:  2 2. Bronchitis. 3. Obstructive sleep apnea, probable.  SECONDARY DIAGNOSES: 1. Hypertension. 2. Diabetes mellitus.  REASON FOR ADMISSION:  Derek Herrera is a 50 year old male, with no prior history of heart disease, who was transferred directly from Derek Herrera office to [facility] ER, for further evaluation and management of PAF with RVR.  HOSPITAL COURSE:  The patient was initially placed on IV diltiazem for rate  control, and started on Xarelto for stroke prophylaxis, per Derek Herrera  recommendation.  A 2-D echo was completed, yielding normal LVEF (EF 55-60%).  Per Derek Herrera recommendation, plan was to proceed with TEE - DC cardioversion, secondary to inadequate rate control.  TEE yielded no  evidence of thrombus, and Derek Herrera then proceeded with successful  DC cardioversion, following application of 150 joules, with subsequent  conversion to NSR, wherein he remained by time of discharge, the  following morning.  Final recommendatins were to treat with Xarelto 20 mg daily, for at least  1 month.  He is also to continue on azithromycin for treatment of bronchitis.  Additionally, recommendation is also to pursue outpatient sleep study, for  probable obstructive sleep apnea.  DISCHARGE LABS:  WBC 9.3, hemoglobin 12.9, hematocrit 40, platelets 292,000.  Sodium 138, potassium 4.6, BUN 12, and creatinine  0.7.  OUTSTANDING LABS:  TSH 2.9.  Lipid profile:  Total cholesterol 169, triglyceride 112, HDL 26, and LDL 121.  BNP 84.  Admission chest x-ray:  No acute cardiopulmonary disease.  DISPOSITION:  Stable.  FOLLOWUP: 1. Derek Herrera in 2-3 weeks, arrangements to be made through our     office number. 2. Derek Herrera in 1 week.  DISCHARGE MEDICATIONS: 1. Xarelto 20 mg daily. 2. Azithromycin 250 mg daily (x4 days total). 3. Tessalon Perles 100 mg b.i.d. p.r.n. 4. Diltiazem CD 240 mg daily. 5. Guaifenesin DM 15 mL q.4 h. p.r.n. 6. Hydrocodone oral suspension 1 teaspoon b.i.d. p.r.n. 7. Lantus insulin at bedtime as directed. 8. Lisinopril 15 mg daily. 9. NovoLog insulin 25 units q.i.d. 10.Simvastatin 80 mg daily.  DURATION OF DISCHARGE ENCOUNTER:  Greater than 30 minutes, including physician time.     Derek Serpe, PA-C   ______________________________ Derek Buckles. Tymesha Ditmore, MD    GS/MEDQ  D:  08/03/2011  T:  08/03/2011  Job:  [id number]  cc:   Derek Savers, MD  Electronically Signed by Derek Searing PA-C on 08/05/2011 02:11:59 PM Electronically Signed by Derek Meres MD on 08/20/2011 10:36:23 PM

## 2011-08-25 ENCOUNTER — Other Ambulatory Visit: Payer: Self-pay | Admitting: *Deleted

## 2011-08-25 MED ORDER — INSULIN GLARGINE 100 UNIT/ML ~~LOC~~ SOLN: 80.0000 [IU] | Freq: Every day | SUBCUTANEOUS | Status: DC

## 2011-08-25 NOTE — Telephone Encounter (Signed)
R'cd fax from [facility] for refill of Lantus  Last OV-06/05/2011  Last filled-07/10/11

## 2011-09-03 ENCOUNTER — Ambulatory Visit (INDEPENDENT_AMBULATORY_CARE_PROVIDER_SITE_OTHER): Payer: PRIVATE HEALTH INSURANCE | Admitting: Internal Medicine

## 2011-09-03 ENCOUNTER — Encounter: Payer: Self-pay | Admitting: Internal Medicine

## 2011-09-03 DIAGNOSIS — G4733 Obstructive sleep apnea (adult) (pediatric): Secondary | ICD-10-CM | POA: Insufficient documentation

## 2011-09-03 DIAGNOSIS — G4730 Sleep apnea, unspecified: Secondary | ICD-10-CM

## 2011-09-03 DIAGNOSIS — Z8679 Personal history of other diseases of the circulatory system: Secondary | ICD-10-CM | POA: Insufficient documentation

## 2011-09-03 DIAGNOSIS — I10 Essential (primary) hypertension: Secondary | ICD-10-CM

## 2011-09-03 DIAGNOSIS — R0989 Other specified symptoms and signs involving the circulatory and respiratory systems: Secondary | ICD-10-CM

## 2011-09-03 DIAGNOSIS — R0683 Snoring: Secondary | ICD-10-CM

## 2011-09-03 DIAGNOSIS — R059 Cough, unspecified: Secondary | ICD-10-CM

## 2011-09-03 DIAGNOSIS — R05 Cough: Secondary | ICD-10-CM

## 2011-09-03 DIAGNOSIS — E7800 Pure hypercholesterolemia, unspecified: Secondary | ICD-10-CM

## 2011-09-03 DIAGNOSIS — I4891 Unspecified atrial fibrillation: Secondary | ICD-10-CM

## 2011-09-03 DIAGNOSIS — J209 Acute bronchitis, unspecified: Secondary | ICD-10-CM | POA: Insufficient documentation

## 2011-09-03 MED ORDER — LOSARTAN POTASSIUM 100 MG PO TABS: 100.0000 mg | ORAL_TABLET | Freq: Every day | ORAL | Status: DC

## 2011-09-03 NOTE — Patient Instructions (Addendum)
Your physician recommends that you schedule a follow-up appointment in: 3 months with Dr Johney Frame  Your physician has recommended you make the following change in your medication:  1)decrease Simvastatin to 40mg   2) Stop Lisinopril 3) Start Cozaar 100mg  daily  Your physician has recommended that you have a sleep study. This test records several body functions during sleep, including: brain activity, eye movement, oxygen and carbon dioxide blood levels, heart rate and rhythm, breathing rate and rhythm, the flow of air through your mouth and nose, snoring, body muscle movements, and chest and belly movement.

## 2011-09-03 NOTE — Assessment & Plan Note (Signed)
Stable Given cough, I will switch ACE inhibitor to an ARB and monitor

## 2011-09-03 NOTE — Progress Notes (Signed)
The patient presents today for routine electrophysiology followup.  He has done well since his recent hospitalization for atrial fibrillation.  He denies any further symptomatic arrhythmias.   Today, he denies symptoms of palpitations, chest pain, shortness of breath, orthopnea, PND, lower extremity edema, dizziness, presyncope, syncope, or neurologic sequela.   He does snore and does not feel well rested in the am.  He also has a chronic dry hacking cough in the am.  He feels that his reflux is controlled.  The patient is otherwise without complaint today.   Past Medical History  Diagnosis Date  . DIABETES MELLITUS, TYPE I 06/29/2007  . HYPERTENSION 06/29/2007  . HYPERCHOLESTEROLEMIA 05/20/2010  . DM nephropathy/sclerosis   . Atrial fibrillation     persistent   Past Surgical History  Procedure Date  . Knee surgery     x's 3  . Tee with cardioversion 08/02/2011    EF of approximately 50-55% conversion to sinus rhythm.  There were no apparent complications    Current Outpatient Prescriptions  Medication Sig Dispense Refill  . glucose blood (ONE TOUCH ULTRA TEST) test strip Use to check blood sugar 5 times daily dx250.01  150 each  5  . insulin aspart (NOVOLOG FLEXPEN) 100 UNIT/ML injection Inject into the skin 3 (three) times daily before meals. Take 45 units with breakfast, and 85 units with the evening meal       . insulin glargine (LANTUS) 100 UNIT/ML injection Inject 80 Units into the skin at bedtime.  30 mL  11  . Insulin Pen Needle (B-D ULTRAFINE III SHORT PEN) 31G X 8 MM MISC 2 (two) times daily.       . Rivaroxaban (XARELTO) 20 MG TABS Take 20 mg by mouth daily.        . simvastatin (ZOCOR) 80 MG tablet Take 0.5 tablets (40 mg total) by mouth at bedtime.      Marland Kitchen losartan (COZAAR) 100 MG tablet Take 1 tablet (100 mg total) by mouth daily.  30 tablet  11    No Known Allergies  History   Social History  . Marital Status: Married    Spouse Name: N/A    Number of Children: 5  .  Years of Education: N/A   Occupational History  . 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 Smoking status: Never Smoker   . Smokeless tobacco: Not on file  . Alcohol Use: No  . Drug Use: No  . Sexually Active:    Other Topics Concern  . Not on file   Social History Narrative  . No narrative on file    Family History  Problem Relation Age of Onset  . Diabetes Maternal Grandmother   . Cancer Paternal Grandfather     ROS-  All systems are reviewed and are negative except as outlined in the HPI above   Physical Exam: Filed Vitals:   09/03/11 1405  BP: 130/81  Pulse: 102  Height: 6\' 1"  (1.854 m)  Weight: 296 lb (134.265 kg)    GEN- The patient is overweight appearing, alert and oriented x 3 today.   Head- normocephalic, atraumatic Eyes-  Sclera clear, conjunctiva pink Ears- hearing intact Oropharynx- clear Neck- supple, no JVP Lymph- no cervical lymphadenopathy Lungs- Clear to ausculation bilaterally, normal work of breathing Heart- Regular rate and rhythm, no murmurs, rubs or gallops, PMI not laterally displaced GI- soft, NT, ND, + BS Extremities- no clubbing, cyanosis, 1+ chronic edema  MS- no significant deformity or atrophy Skin- no rash or lesion Psych- euthymic mood, full affect Neuro- strength and sensation are intact  ekg today reveals sinus rhythm 94 bpm, otherwiser normal ekg  Assessment and Plan:

## 2011-09-03 NOTE — Assessment & Plan Note (Signed)
Doing well s/p cardioversion Continue xarelto long term for stroke prevention  Sleep study

## 2011-09-03 NOTE — Assessment & Plan Note (Signed)
Unclear etiology ddx includes ace inhibitor cough, allergies, or GERD Not volume overloaded on exam  I will stop ACE inhibitor and switch to cozaar today.  He may retry an ACE inhibitor in the future if his cough resolves. He will follow-up with PCP if not improved.

## 2011-09-03 NOTE — Assessment & Plan Note (Signed)
We will obtain a sleep study to evaluate for sleep apnea  Weight reduction advised

## 2011-09-05 ENCOUNTER — Ambulatory Visit (HOSPITAL_BASED_OUTPATIENT_CLINIC_OR_DEPARTMENT_OTHER): Payer: PRIVATE HEALTH INSURANCE

## 2011-09-09 LAB — URINALYSIS, ROUTINE W REFLEX MICROSCOPIC
Bilirubin Urine: NEGATIVE
Glucose, UA: NEGATIVE
Hgb urine dipstick: NEGATIVE
Ketones, ur: NEGATIVE
Leukocytes, UA: NEGATIVE
Nitrite: NEGATIVE
Protein, ur: 100 — AB
Specific Gravity, Urine: 1.014
Urobilinogen, UA: 0.2
pH: 6

## 2011-09-09 LAB — BASIC METABOLIC PANEL
BUN: 10
CO2: 25
Calcium: 9.1
Chloride: 101
Creatinine, Ser: 0.74
GFR calc Af Amer: >60
GFR calc non Af Amer: >60
Glucose, Bld: 143 — ABNORMAL HIGH
Potassium: 3.7
Sodium: 135

## 2011-09-09 LAB — DIFFERENTIAL
Basophils Absolute: 0.1
Basophils Relative: 1
Eosinophils Absolute: 0.4
Eosinophils Relative: 4
Lymphocytes Relative: 16
Lymphs Abs: 1.9
Monocytes Absolute: 0.8
Monocytes Relative: 7
Neutro Abs: 8.6 — ABNORMAL HIGH
Neutrophils Relative %: 73

## 2011-09-09 LAB — CBC
HCT: 42.9
Hemoglobin: 14.5
MCHC: 33.8
MCV: 82.5
Platelets: 277
RBC: 5.2
RDW: 13.4
WBC: 11.9 — ABNORMAL HIGH

## 2011-09-09 LAB — URINE MICROSCOPIC-ADD ON

## 2011-09-09 LAB — POCT CARDIAC MARKERS
CKMB, poc: 2.9
Myoglobin, poc: 122
Operator id: 146091
Troponin i, poc: <0.05

## 2011-09-18 ENCOUNTER — Other Ambulatory Visit: Payer: Self-pay | Admitting: *Deleted

## 2011-09-18 DIAGNOSIS — E7800 Pure hypercholesterolemia, unspecified: Secondary | ICD-10-CM

## 2011-09-18 MED ORDER — SIMVASTATIN 80 MG PO TABS: 40.0000 mg | ORAL_TABLET | Freq: Every day | ORAL | Status: DC

## 2011-09-18 NOTE — Telephone Encounter (Signed)
R'cd fax from [facility] for refill of Simvastatin   Last OV-06/2011  Last filled 08/05/2011

## 2011-10-10 ENCOUNTER — Encounter (HOSPITAL_BASED_OUTPATIENT_CLINIC_OR_DEPARTMENT_OTHER): Payer: Self-pay

## 2011-10-10 ENCOUNTER — Ambulatory Visit (HOSPITAL_BASED_OUTPATIENT_CLINIC_OR_DEPARTMENT_OTHER): Payer: PRIVATE HEALTH INSURANCE | Attending: Internal Medicine

## 2011-10-10 VITALS — Ht 76.0 in | Wt 298.0 lb

## 2011-10-10 DIAGNOSIS — G4733 Obstructive sleep apnea (adult) (pediatric): Secondary | ICD-10-CM

## 2011-10-22 DIAGNOSIS — G4733 Obstructive sleep apnea (adult) (pediatric): Secondary | ICD-10-CM

## 2011-10-22 NOTE — Procedures (Signed)
NAME:  Derek Herrera, Derek Herrera NO.:  [id number]  MEDICAL RECORD NO.:  [id number]          PATIENT TYPE:  OUT  LOCATION:  SLEEP CENTER                 FACILITY:  [facility]  PHYSICIAN:  Barbaraann Share, MD,FCCPDATE OF BIRTH:  03-01-60  DATE OF STUDY:  10/10/2011                           NOCTURNAL POLYSOMNOGRAM  REFERRING PHYSICIAN:  Hillis Range, MD  INDICATION FOR STUDY:  Hypersomnia with sleep apnea.  EPWORTH SLEEPINESS SCORE:  21.  MEDICATIONS:  SLEEP ARCHITECTURE:  The patient had a total sleep time of 338 minutes with no slow-wave sleep and only 63 minutes of REM in the later half of the night.  Sleep onset latency was mildly prolonged at 31 minute, and REM onset was prolonged.  Sleep efficiency was poor at 69% during the diagnostic portion of the study and improved greatly to 94% during the titration portion of the study.  RESPIRATORY DATA:  The patient was noted to have by split night study 53 obstructive events in the first 143 minutes of sleep.  This gave him an apnea-hypopnea index of 22 events per hour during the diagnostic portion of the study.  The events occurred in all body positions and there was loud snoring noted throughout.  By protocol, the patient was then fitted with a ResMed medium wide Mirage FX full-face mask, and CPAP titration was initiated.  At a final CPAP pressure of 12 cm of water, he appeared to have adequate control of his obstructive events even during REM. However, he did not have significant supine sleep during this time.  OXYGEN DATA:  There was O2 desaturation as low as 73% during the night with the patient's obstructive events.  His desats were well controlled on therapeutic CPAP pressure.  CARDIAC DATA:  Occasional PAC noted, but no clinically significant arrhythmias seen.  MOVEMENT-PARASOMNIA:  The patient was noted to have no significant leg jerks or other abnormal behaviors noted.  IMPRESSIONS-RECOMMENDATIONS: 1. Split  night study reveals moderate obstructive sleep apnea/hypopnea     syndrome with an AHI during the diagnostic portion of the study of     22 events per hour with O2 desaturation as low as 73%.  The patient     was then fitted with a medium wide ResMed Mirage FX full-face mask,     and titrated to what appears to be an optimal pressure of 12 cm of     water.  The patient should also be encouraged to work aggressively     on weight     loss. 2. Occasional PAC noted, but no clinically significant arrhythmias     were seen.     Barbaraann Share, MD,FCCP Diplomate, American Board of Sleep Medicine Electronically Signed    [facility]/MEDQ  D:  10/22/2011 08:15:34  T:  10/22/2011 08:35:54  Job:  [id number]

## 2011-10-27 NOTE — Progress Notes (Signed)
Addended by: Dennis Bast F on: 10/27/2011 12:47 PM   Modules accepted: Orders

## 2012-01-06 ENCOUNTER — Other Ambulatory Visit: Payer: Self-pay

## 2012-01-06 MED ORDER — INSULIN PEN NEEDLE 31G X 8 MM MISC: 1.0000 | Freq: Two times a day (BID) | Status: DC

## 2012-01-07 ENCOUNTER — Other Ambulatory Visit: Payer: Self-pay | Admitting: *Deleted

## 2012-01-07 MED ORDER — GLUCOSE BLOOD VI STRP: ORAL_STRIP | Status: DC

## 2012-01-07 NOTE — Telephone Encounter (Signed)
R'cd fax from [facility] for refill of Onetouch Ultra test strips.  Last OV-06/2011      Last filled-12/04/2011

## 2012-02-23 ENCOUNTER — Other Ambulatory Visit: Payer: Self-pay | Admitting: *Deleted

## 2012-02-23 ENCOUNTER — Telehealth: Payer: Self-pay | Admitting: *Deleted

## 2012-02-23 MED ORDER — GLUCOSE BLOOD VI STRP: ORAL_STRIP | Status: DC

## 2012-02-23 NOTE — Telephone Encounter (Signed)
R'cd fax from [facility] for refill of test strips

## 2012-02-23 NOTE — Telephone Encounter (Signed)
Pt called regarding refill of test strips, and also to make an appointment to F/U with Dr Everardo All. Left message for pt to callback office.

## 2012-02-25 NOTE — Telephone Encounter (Signed)
Left message for pt to callback office.  

## 2012-02-26 NOTE — Telephone Encounter (Signed)
Pt informed that MD advised testing blood sugar twice daily, pt has upcoming appointment with MD to discuss and to F/U on DM

## 2012-03-12 ENCOUNTER — Other Ambulatory Visit: Payer: Self-pay | Admitting: Internal Medicine

## 2012-03-12 MED ORDER — DILTIAZEM HCL ER 240 MG PO CP24: 240.0000 mg | ORAL_CAPSULE | Freq: Every day | ORAL | Status: DC

## 2012-03-12 MED ORDER — RIVAROXABAN 20 MG PO TABS: 20.0000 mg | ORAL_TABLET | Freq: Every day | ORAL | Status: DC

## 2012-03-23 ENCOUNTER — Other Ambulatory Visit (INDEPENDENT_AMBULATORY_CARE_PROVIDER_SITE_OTHER): Payer: PRIVATE HEALTH INSURANCE

## 2012-03-23 ENCOUNTER — Encounter: Payer: Self-pay | Admitting: Endocrinology

## 2012-03-23 ENCOUNTER — Ambulatory Visit (INDEPENDENT_AMBULATORY_CARE_PROVIDER_SITE_OTHER): Payer: PRIVATE HEALTH INSURANCE | Admitting: Endocrinology

## 2012-03-23 VITALS — BP 102/74 | HR 70 | Temp 98.7°F | Ht 76.0 in | Wt 309.0 lb

## 2012-03-23 DIAGNOSIS — E109 Type 1 diabetes mellitus without complications: Secondary | ICD-10-CM

## 2012-03-23 LAB — HEMOGLOBIN A1C: Hgb A1c MFr Bld: 8 % — ABNORMAL HIGH (ref 4.6–6.5)

## 2012-03-23 NOTE — Progress Notes (Signed)
Subjective:    Patient ID: Derek Herrera, male    DOB: 1961-04-09, 51 y.o.   MRN: [id number]  HPI pt returns for f/u of IDDM (dx'ed 1995, complicated by nephropathy). pt states he feels well in general.  no cbg record, but states cbg's are highest in am (higher than at hs, but he sometimes eats at hs).  He says it is not high in am if he does not eat at hs.  He ran out of the lantus 10 days ago.  On further questioning, he takes a widely varying dosage of insulin.   Past Medical History  Diagnosis Date  . DIABETES MELLITUS, TYPE I 06/29/2007  . HYPERTENSION 06/29/2007  . HYPERCHOLESTEROLEMIA 05/20/2010  . DM nephropathy/sclerosis   . Atrial fibrillation     persistent  . Anemia   . Clotting disorder     Past Surgical History  Procedure Date  . Knee surgery     x's 3  . Tee with cardioversion 08/02/2011    EF of approximately 50-55% conversion to sinus rhythm.  There were no apparent complications    History   Social History  . Marital Status: Married    Spouse Name: N/A    Number of Children: 5  . Years of Education: N/A   Occupational History  . 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 Smoking status: Never Smoker   . Smokeless tobacco: Not on file  . Alcohol Use: No  . Drug Use: No  . Sexually Active:    Other Topics Concern  . Not on file   Social History Narrative  . No narrative on file    Current Outpatient Prescriptions on File Prior to Visit  Medication Sig Dispense Refill  . diltiazem (DILACOR XR) 240 MG 24 hr capsule Take 1 capsule (240 mg total) by mouth daily.  30 capsule  5  . insulin aspart (NOVOLOG FLEXPEN) 100 UNIT/ML injection Inject into the skin 3 (three) times daily before meals. Take 45 units with breakfast, and 85 units with the evening meal       . insulin glargine (LANTUS) 100 UNIT/ML injection Inject 80 Units into the skin at bedtime.  30 mL  11  . Insulin Pen Needle (B-D ULTRAFINE III SHORT PEN) 31G X 8 MM MISC 1  each by Other route 2 (two) times daily.  150 each  1  . losartan (COZAAR) 100 MG tablet Take 1 tablet (100 mg total) by mouth daily.  30 tablet  11  . Rivaroxaban (XARELTO) 20 MG TABS Take 20 mg by mouth daily.  30 tablet  10  . simvastatin (ZOCOR) 80 MG tablet Take 0.5 tablets (40 mg total) by mouth at bedtime.  15 tablet  5    No Known Allergies  Family History  Problem Relation Age of Onset  . Diabetes Maternal Grandmother   . Cancer Paternal Grandfather     BP 102/74  Pulse 70  Temp(Src) 98.7 F (37.1 C) (Oral)  Ht 6\' 4"  (1.93 m)  Wt 309 lb (140.161 kg)  BMI 37.61 kg/m2  SpO2 96%  Review of Systems denies hypoglycemia    Objective:   Physical Exam Pulses: dorsalis pedis intact bilat.   Feet: no deformity.  no ulcer on the feet.  feet are of normal color and temp.  1+ bilat leg edema Neuro: sensation is intact to touch on the feet.      Lab Results  Component  Value Date   HGBA1C 8.0* 03/23/2012   Assessment & Plan:  DM.  therapy limited by noncompliance with insulin dosage and f/u appts.  i'll do the best i can.

## 2012-03-23 NOTE — Patient Instructions (Addendum)
blood tests are being requested for you today.  You will receive a letter with results. pending the test results, please continue lantus 80 units at bedtime, and novolog (just before each meal) 45-0-95 units.  There is no medical reason to eat a snack at bedtime.  However, if you choise to eat at bedtime, take 15 units of novolog with it.  Please schedule a follow-up appointment in 3 months.  check your blood sugar 5 times a day.  vary the time of day when you check, between before the 3 meals, and at bedtime.  also check if you have symptoms of your blood sugar being too high or too low.  please keep a record of the readings and bring it to your next appointment here.  please call us sooner if you are having low blood sugar episodes.

## 2012-03-24 ENCOUNTER — Other Ambulatory Visit: Payer: Self-pay | Admitting: *Deleted

## 2012-03-24 MED ORDER — GLUCOSE BLOOD VI STRP: ORAL_STRIP | Status: DC

## 2012-03-24 NOTE — Telephone Encounter (Signed)
Pt called for rx for test strips with updated testing frequency, rx sent, pt informed via VM and to callback office with any questions/concerns.

## 2012-03-25 ENCOUNTER — Telehealth: Payer: Self-pay | Admitting: *Deleted

## 2012-03-25 NOTE — Telephone Encounter (Signed)
Called pt to inform of A1c results, pt informed (letter also mailed to pt). 

## 2012-04-09 ENCOUNTER — Other Ambulatory Visit: Payer: Self-pay | Admitting: *Deleted

## 2012-04-09 DIAGNOSIS — E7800 Pure hypercholesterolemia, unspecified: Secondary | ICD-10-CM

## 2012-04-09 MED ORDER — SIMVASTATIN 80 MG PO TABS: 40.0000 mg | ORAL_TABLET | Freq: Every day | ORAL | Status: DC

## 2012-04-09 NOTE — Telephone Encounter (Signed)
R'cd fax from [facility] for refill of Simvastatin.

## 2012-04-30 ENCOUNTER — Other Ambulatory Visit: Payer: Self-pay

## 2012-04-30 MED ORDER — INSULIN ASPART 100 UNIT/ML ~~LOC~~ SOLN: SUBCUTANEOUS | Status: DC

## 2012-06-30 ENCOUNTER — Other Ambulatory Visit: Payer: Self-pay | Admitting: *Deleted

## 2012-06-30 MED ORDER — INSULIN ASPART 100 UNIT/ML ~~LOC~~ SOLN: SUBCUTANEOUS | Status: DC

## 2012-06-30 NOTE — Telephone Encounter (Signed)
R'cd fax from [facility] for refill of Novolog Flexpen.

## 2012-08-19 ENCOUNTER — Encounter: Payer: Self-pay | Admitting: Internal Medicine

## 2012-08-19 ENCOUNTER — Ambulatory Visit (INDEPENDENT_AMBULATORY_CARE_PROVIDER_SITE_OTHER): Payer: PRIVATE HEALTH INSURANCE | Admitting: Internal Medicine

## 2012-08-19 VITALS — BP 131/88 | HR 129 | Ht 76.0 in | Wt 317.0 lb

## 2012-08-19 DIAGNOSIS — I10 Essential (primary) hypertension: Secondary | ICD-10-CM

## 2012-08-19 DIAGNOSIS — I4891 Unspecified atrial fibrillation: Secondary | ICD-10-CM

## 2012-08-19 DIAGNOSIS — G4733 Obstructive sleep apnea (adult) (pediatric): Secondary | ICD-10-CM

## 2012-08-19 DIAGNOSIS — G4730 Sleep apnea, unspecified: Secondary | ICD-10-CM

## 2012-08-19 MED ORDER — DILTIAZEM HCL ER COATED BEADS 360 MG PO CP24: 360.0000 mg | ORAL_CAPSULE | Freq: Every day | ORAL | Status: DC

## 2012-08-19 NOTE — Patient Instructions (Addendum)
Your physician recommends that you schedule a follow-up appointment in: 1 weeks with Tereso Newcomer, PA.  Your physician recommends that you schedule a follow-up appointment in: 2 months with Dr. Johney Frame.  Increase Cardizem to 360mg  daily.  New Rx sent to pharmacy.  You have been referred to Dr. Vassie Loll at [facility].

## 2012-08-19 NOTE — Assessment & Plan Note (Signed)
H/o persistent atrial fibrillation.  Todays ekg suggest either atrial flutter or atrial fibrillation.  Though atrial activity in V1 is disorganized, I cannot completely exclude isthmus dependant atrial flutter from this ekg. He think that his atrial fibrillation return is due to bronchitis.   At this point, I will have him return in 1 week.  If he remains in afib or atrial flutter then I think that we should proceed with cardioversion.  He is appropriately anticoagulated with xarelto.  I think that he will likely require an AAD long term.  His options would be flecainide or tikosyn.  Given prior EF 40s, I would repeat an echo prior to starting flecainide.  In addition, he would require a myoview to exclude coronary disease. I will defer ordering these tests until we see if he remains in afib next week. I have increased diltiazem to 360mg  daily today.

## 2012-08-19 NOTE — Assessment & Plan Note (Signed)
Above goal Increase diltiazem as above

## 2012-08-19 NOTE — Progress Notes (Signed)
PCP:  Judie Petit, MD  The patient presents today for routine electrophysiology followup.  Since last being seen in our clinic, the patient reports doing reasonably well.  He is not certain as to how much afib he has had.  He does have occasional SOB and fatigue but denies palpitations.  He reports cough and URI symptoms for 2-3 days.  Several of his family members have had similar symptoms.   Today, he denies symptoms of chest pain, shortness of breath, orthopnea, PND, lower extremity edema, dizziness, presyncope, syncope, or neurologic sequela.  The patient feels that he is tolerating medications without difficulties and is otherwise without complaint today.   Past Medical History  Diagnosis Date  . DIABETES MELLITUS, TYPE I 06/29/2007  . HYPERTENSION 06/29/2007  . HYPERCHOLESTEROLEMIA 05/20/2010  . DM nephropathy/sclerosis   . Persistent atrial fibrillation   . Anemia   . Clotting disorder   . Obstructive sleep apnea     moderate by sleep study 11/13   Past Surgical History  Procedure Date  . Knee surgery     x's 3  . Tee with cardioversion 08/02/2011    EF of approximately 50-55% conversion to sinus rhythm.  There were no apparent complications    Current Outpatient Prescriptions  Medication Sig Dispense Refill  . glucose blood (ONE TOUCH ULTRA TEST) test strip Use as instructed five times daily dx 250.01  500 each  3  . insulin aspart (NOVOLOG FLEXPEN) 100 UNIT/ML injection Take 45 units with breakfast, and 95 units with the evening meal  45 mL  2  . insulin glargine (LANTUS) 100 UNIT/ML injection Inject 80 Units into the skin at bedtime.  30 mL  11  . Insulin Pen Needle (B-D ULTRAFINE III SHORT PEN) 31G X 8 MM MISC 1 each by Other route 2 (two) times daily.  150 each  1  . losartan (COZAAR) 100 MG tablet Take 1 tablet (100 mg total) by mouth daily.  30 tablet  11  . Rivaroxaban (XARELTO) 20 MG TABS Take 20 mg by mouth daily.  30 tablet  10  . simvastatin (ZOCOR) 80 MG  tablet Take 0.5 tablets (40 mg total) by mouth at bedtime.  15 tablet  5  . diltiazem (CARDIZEM CD) 360 MG 24 hr capsule Take 1 capsule (360 mg total) by mouth daily.  90 capsule  3    No Known Allergies  History   Social History  . Marital Status: Married    Spouse Name: N/A    Number of Children: 5  . Years of Education: N/A   Occupational History  . 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 Smoking status: Never Smoker   . Smokeless tobacco: Not on file  . Alcohol Use: No  . Drug Use: No  . Sexually Active:    Other Topics Concern  . Not on file   Social History Narrative  . No narrative on file    Family History  Problem Relation Age of Onset  . Diabetes Maternal Grandmother   . Cancer Paternal Grandfather     ROS-  All systems are reviewed and are negative except as outlined in the HPI above   Physical Exam: Filed Vitals:   08/19/12 1613  BP: 131/88  Pulse: 129  Height: 6\' 4"  (1.93 m)  Weight: 317 lb (143.79 kg)    GEN- The patient is overweight appearing, alert and oriented x 3 today.  Head- normocephalic, atraumatic Eyes-  Sclera clear, conjunctiva pink Ears- hearing intact Oropharynx- clear Neck- supple, no JVP Lymph- no cervical lymphadenopathy Lungs- Clear to ausculation bilaterally, normal work of breathing Heart- irregular rate and rhythm, no murmurs, rubs or gallops, PMI not laterally displaced GI- soft, NT, ND, + BS Extremities- no clubbing, cyanosis, or edema MS- no significant deformity or atrophy Skin- no rash or lesion Psych- euthymic mood, full affect Neuro- strength and sensation are intact  ekg today reveals likely atrial flutter.  Though the inferior leads have nice negative flutter waves, V1 is somewhat disorganized and not consistent with typical flutter Echo 2012 and sleep study are reviewed  Assessment and Plan:

## 2012-08-19 NOTE — Assessment & Plan Note (Signed)
Recent sleep study reveals moderate sleep apnea I will refer to Pulmonary for initiation of CPAP Weight loss is encouraged

## 2012-08-27 ENCOUNTER — Other Ambulatory Visit: Payer: Self-pay | Admitting: Nurse Practitioner

## 2012-08-27 ENCOUNTER — Encounter: Payer: Self-pay | Admitting: Nurse Practitioner

## 2012-08-27 ENCOUNTER — Ambulatory Visit (INDEPENDENT_AMBULATORY_CARE_PROVIDER_SITE_OTHER): Payer: PRIVATE HEALTH INSURANCE | Admitting: Nurse Practitioner

## 2012-08-27 VITALS — BP 118/86 | HR 145 | Ht 76.0 in | Wt 314.8 lb

## 2012-08-27 DIAGNOSIS — I4892 Unspecified atrial flutter: Secondary | ICD-10-CM

## 2012-08-27 DIAGNOSIS — Z01810 Encounter for preprocedural cardiovascular examination: Secondary | ICD-10-CM

## 2012-08-27 LAB — CBC WITH DIFFERENTIAL/PLATELET
Basophils Relative: 0 % (ref 0–1)
Hemoglobin: 14.7 g/dL (ref 13.0–17.0)
Lymphs Abs: 1.8 10*3/uL (ref 0.7–4.0)
Monocytes Relative: 8 % (ref 3–12)
Neutro Abs: 10.3 10*3/uL — ABNORMAL HIGH (ref 1.7–7.7)
Neutrophils Relative %: 76 % (ref 43–77)
Platelets: 318 10*3/uL (ref 150–400)
RBC: 5.27 MIL/uL (ref 4.22–5.81)

## 2012-08-27 LAB — APTT: aPTT: 47 seconds — ABNORMAL HIGH (ref 24–37)

## 2012-08-27 LAB — PROTIME-INR: Prothrombin Time: 22.1 seconds — ABNORMAL HIGH (ref 11.6–15.2)

## 2012-08-27 LAB — BASIC METABOLIC PANEL
BUN: 15 mg/dL (ref 6–23)
Calcium: 9 mg/dL (ref 8.4–10.5)
Creat: 0.81 mg/dL (ref 0.50–1.35)

## 2012-08-27 MED ORDER — FLECAINIDE ACETATE 100 MG PO TABS: 100.0000 mg | ORAL_TABLET | Freq: Two times a day (BID) | ORAL | Status: DC

## 2012-08-27 MED ORDER — AMOXICILLIN-POT CLAVULANATE 875-125 MG PO TABS: 1.0000 | ORAL_TABLET | Freq: Two times a day (BID) | ORAL | Status: DC

## 2012-08-27 NOTE — Patient Instructions (Addendum)
We are going to get an ultrasound of your heart early next week  I am going to give you Augmentin for your bronchitis - take two times a day for one week  We are starting Flecainide 100 mg two times a day  We will arrange for a cardioversion towards the end of next week.   Call the [facility] office at [phone] if you have any questions, problems or concerns.    You are scheduled for a cardioversion on Friday, October 4th at _11:15___ with Dr. Clifton James. Please go to [facility] on Friday, October 4th at 10:00. Do not have any food or drink after midnight on Thursday.  You may take your medicines with a sip of water on the day of your procedure.  HOWEVER- take half dose of Lantus the night before your procedure, and hold your Novolog the morning of your procedure.   You will need someone to drive you home following your procedure.   Electrical Cardioversion Cardioversion is the delivery of a jolt of electricity to change the rhythm of the heart. Sticky patches or metal paddles are placed on the chest to deliver the electricity from a special device. This is done to restore a normal rhythm. A rhythm that is too fast or not regular keeps the heart from pumping well. Compared to medicines used to change an abnormal rhythm, cardioversion is faster and works better. It is also unpleasant and may dislodge blood clots from the heart. WHEN WOULD THIS BE DONE?  In an emergency:   There is low or no blood pressure as a result of the heart rhythm.   Normal rhythm must be restored as fast as possible to protect the brain and heart from further damage.   It may save a life.   For less serious heart rhythms, such as atrial fibrillation or flutter, in which:   The heart is beating too fast or is not regular.   The heart is still able to pump enough blood, but not as well as it should.   Medicine to change the rhythm has not worked.   It is safe to wait in  order to allow time for preparation.  LET YOUR CAREGIVER KNOW ABOUT:   Every medicine you are taking. It is very important to do this! Know when to take or stop taking any of them.   Any time in the past that you have felt your heart was not beating normally.  RISKS AND COMPLICATIONS   Clots may form in the chambers of the heart if it is beating too fast. These clots may be dislodged during the procedure and travel to other parts of the body.   There is risk of a stroke during and after the procedure if a clot moves. Blood thinners lower this risk.   You may have a special test of your heart (TEE) to make sure there are no clots in your heart.  BEFORE THE PROCEDURE   You may have some tests to see how well your heart is working.   You may start taking blood thinners so your blood does not clot as easily.   Other drugs may be given to help your heart work better.  PROCEDURE (SCHEDULED)  The procedure is typically done in a hospital by a heart doctor (cardiologist).   You will be told when and where to go.   You may be given some medicine through an intravenous (IV) access to reduce  discomfort and make you sleepy before the procedure.   Your whole body may move when the shock is delivered. Your chest may feel sore.   You may be able to go home after a few hours. Your heart rhythm will be watched to make sure it does not change.  HOME CARE INSTRUCTIONS   Only take medicine as directed by your caregiver. Be sure you understand how and when to take your medicine.   Learn how to feel your pulse and check it often.   Limit your activity for 48 hours.   Avoid caffeine and other stimulants as directed.  SEEK MEDICAL CARE IF:   You feel like your heart is beating too fast or your pulse is not regular.   You have any questions about your medicines.   You have bleeding that will not stop.  SEEK IMMEDIATE MEDICAL CARE IF:   You are dizzy or feel faint.   It is hard to breathe or  you feel short of breath.   There is a change in discomfort in your chest.   Your speech is slurred or you have trouble moving your arm or leg on one side.   You get a muscle cramp.   Your fingers or toes turn cold or blue.  MAKE SURE YOU:   Understand these instructions.   Will watch your condition.   Will get help right away if you are not doing well or get worse.  Document Released: 11/07/2002 Document Revised: 11/06/2011 Document Reviewed: 03/08/2008 [facility] Patient Information 2012 [place], [place].

## 2012-08-27 NOTE — Progress Notes (Signed)
 Derek Herrera Date of Birth: 07/25/1961 Medical Record #[id number]  History of Present Illness: Derek Herrera is seen back today for an approximate 1 week check. He is seen for Dr. Allred. He has atrial fib. Other issues include DM, HTN, HLD, nephropathy, anemia, cloting disorder and OSA. He was here on the 19th. Rate was elevated. He had had some bronchitis which may have been the trigger. His Diltiazem was increased.   He comes in today. He is here alone. He is still having issues with congestion, cough, bronchitis and is short of breath. Coughing up yellow sputum. Congested. Short of breath. Heart is still racing. He has been trying to work. He does get a little dizzy. No syncope. No chest pain.  He has been on his Xarelto. No missed doses. Everyone but his wife is sick at home and this is his 3rd week of it. Mild swelling if he is on his feet all day.   Current Outpatient Prescriptions on File Prior to Visit  Medication Sig Dispense Refill  . diltiazem (CARDIZEM CD) 360 MG 24 hr capsule Take 1 capsule (360 mg total) by mouth daily.  90 capsule  3  . glucose blood (ONE TOUCH ULTRA TEST) test strip Use as instructed five times daily dx 250.01  500 each  3  . insulin aspart (NOVOLOG FLEXPEN) 100 UNIT/ML injection Take 45 units with breakfast, and 95 units with the evening meal  45 mL  2  . insulin glargine (LANTUS) 100 UNIT/ML injection Inject 80 Units into the skin at bedtime.  30 mL  11  . Insulin Pen Needle (B-D ULTRAFINE III SHORT PEN) 31G X 8 MM MISC 1 each by Other route 2 (two) times daily.  150 each  1  . losartan (COZAAR) 100 MG tablet Take 1 tablet (100 mg total) by mouth daily.  30 tablet  11  . Rivaroxaban (XARELTO) 20 MG TABS Take 20 mg by mouth daily.  30 tablet  10  . simvastatin (ZOCOR) 80 MG tablet Take 0.5 tablets (40 mg total) by mouth at bedtime.  15 tablet  5  . flecainide (TAMBOCOR) 100 MG tablet Take 1 tablet (100 mg total) by mouth 2 (two) times daily.  60 tablet  3     No Known Allergies  Past Medical History  Diagnosis Date  . DIABETES MELLITUS, TYPE I 06/29/2007  . HYPERTENSION 06/29/2007  . HYPERCHOLESTEROLEMIA 05/20/2010  . DM nephropathy/sclerosis   . Persistent atrial fibrillation   . Anemia   . Clotting disorder   . Obstructive sleep apnea     moderate by sleep study 11/13    Past Surgical History  Procedure Date  . Knee surgery     x's 3  . Tee with cardioversion 08/02/2011    EF of approximately 50-55% conversion to sinus rhythm.  There were no apparent complications    History  Smoking status  . Never Smoker   Smokeless tobacco  . Not on file    History  Alcohol Use No    Family History  Problem Relation Age of Onset  . Diabetes Maternal Grandmother   . Cancer Paternal Grandfather     Review of Systems: The review of systems is per the HPI.  All other systems were reviewed and are negative.  Physical Exam: BP 118/86  Pulse 145  Ht 6' 4" (1.93 m)  Wt 314 lb 12.8 oz (142.792 kg)  BMI 38.32 kg/m2 Patient is very pleasant and in no acute distress.   He is of large build. Skin is warm and dry. Color is normal.  HEENT is unremarkable. Normocephalic/atraumatic. PERRL. Sclera are nonicteric. Neck is supple. No masses. No JVD. Lungs are clear. Cardiac exam shows an irregular rate and rhythm. He is tachycardic. Abdomen is obese but soft. Extremities are without edema. Gait and ROM are intact. No gross neurologic deficits noted.   LABORATORY DATA: EKG today shows atrial flutter with RVR. Tracing is reviewed with Dr. Allred.  Lab Results  Component Value Date   WBC 9.3 08/03/2011   HGB 12.9* 08/03/2011   HCT 40.3 08/03/2011   PLT 292 08/03/2011   GLUCOSE 124* 08/03/2011   CHOL 169 08/01/2011   TRIG 112 08/01/2011   HDL 26* 08/01/2011   LDLCALC 121* 08/01/2011   ALT 24 07/31/2011   AST 23 07/31/2011   NA 138 08/03/2011   K 4.6 08/03/2011   CL 102 08/03/2011   CREATININE 0.67 08/03/2011   BUN 12 08/03/2011   CO2 31 08/03/2011   TSH 2.925  08/01/2011   HGBA1C 8.0* 03/23/2012   MICROALBUR 139.4* 05/20/2010     Assessment / Plan:  1. Atrial flutter with RVR - I have reviewed his case with Dr. Allred. He would like to go ahead and start Flecainide 100 mg BID, obtain echo early next week and plan for cardioversion at the end of next week. Once we have restored sinus rhythm, he will need a stress myoview study. Labs are checked today. Cardioversion is planned for October 4th.   2. Bronchitis - will give him a course of Augmentin.   Patient is agreeable to this plan and will call if any problems develop in the interim.   

## 2012-08-30 ENCOUNTER — Telehealth: Payer: Self-pay | Admitting: *Deleted

## 2012-08-30 ENCOUNTER — Other Ambulatory Visit (HOSPITAL_COMMUNITY): Payer: Self-pay | Admitting: Internal Medicine

## 2012-08-30 DIAGNOSIS — I4892 Unspecified atrial flutter: Secondary | ICD-10-CM

## 2012-08-30 NOTE — Telephone Encounter (Signed)
Patient need post hospital follow-up in 10 days from Cardioversion scheduled for 09/03/12. Message left for patient to call and ask for [facility].

## 2012-08-31 ENCOUNTER — Ambulatory Visit (HOSPITAL_COMMUNITY): Payer: PRIVATE HEALTH INSURANCE | Attending: Cardiology

## 2012-08-31 DIAGNOSIS — I4891 Unspecified atrial fibrillation: Secondary | ICD-10-CM | POA: Insufficient documentation

## 2012-08-31 DIAGNOSIS — E119 Type 2 diabetes mellitus without complications: Secondary | ICD-10-CM | POA: Insufficient documentation

## 2012-08-31 DIAGNOSIS — I10 Essential (primary) hypertension: Secondary | ICD-10-CM | POA: Insufficient documentation

## 2012-08-31 DIAGNOSIS — I4892 Unspecified atrial flutter: Secondary | ICD-10-CM

## 2012-08-31 NOTE — Progress Notes (Signed)
Echocardiogram performed.  

## 2012-09-01 ENCOUNTER — Telehealth: Payer: Self-pay | Admitting: Internal Medicine

## 2012-09-01 ENCOUNTER — Telehealth: Payer: Self-pay | Admitting: *Deleted

## 2012-09-01 DIAGNOSIS — R05 Cough: Secondary | ICD-10-CM

## 2012-09-01 DIAGNOSIS — R0602 Shortness of breath: Secondary | ICD-10-CM

## 2012-09-01 NOTE — Telephone Encounter (Signed)
Message copied by Awilda Bill on Wed Sep 01, 2012  9:37 AM ------      Message from: Rosalio Macadamia      Created: Tue Aug 31, 2012  7:52 AM       Ok to report. Labs are ok for cardioversion. His white count was up and he was treated with antibiotics.

## 2012-09-01 NOTE — Telephone Encounter (Signed)
New problem:  Upcoming procedure on 10/4. Had ultrasound on yesterday.  C/O  still having a cough, sob.

## 2012-09-01 NOTE — Telephone Encounter (Signed)
Discussed with Dr Johney Frame and called patient to talk with him about plan.  lmom for him that I will call him back tomorrow

## 2012-09-01 NOTE — Telephone Encounter (Signed)
Called patient regarding labs.  Left msg to call back.  Vista Mink, CMA

## 2012-09-02 ENCOUNTER — Ambulatory Visit (INDEPENDENT_AMBULATORY_CARE_PROVIDER_SITE_OTHER): Payer: PRIVATE HEALTH INSURANCE | Admitting: Internal Medicine

## 2012-09-02 ENCOUNTER — Ambulatory Visit (INDEPENDENT_AMBULATORY_CARE_PROVIDER_SITE_OTHER)
Admission: RE | Admit: 2012-09-02 | Discharge: 2012-09-02 | Disposition: A | Discharge status: Home / Self Care | Payer: PRIVATE HEALTH INSURANCE | Source: Ambulatory Visit | Attending: Internal Medicine | Admitting: Internal Medicine

## 2012-09-02 VITALS — BP 104/90 | HR 122 | Temp 97.6°F | Wt 315.0 lb

## 2012-09-02 DIAGNOSIS — R0602 Shortness of breath: Secondary | ICD-10-CM

## 2012-09-02 DIAGNOSIS — J209 Acute bronchitis, unspecified: Secondary | ICD-10-CM

## 2012-09-02 DIAGNOSIS — R05 Cough: Secondary | ICD-10-CM

## 2012-09-02 DIAGNOSIS — R059 Cough, unspecified: Secondary | ICD-10-CM

## 2012-09-02 MED ORDER — FLUTICASONE PROPIONATE HFA 110 MCG/ACT IN AERO: 2.0000 | INHALATION_SPRAY | Freq: Two times a day (BID) | RESPIRATORY_TRACT | Status: DC

## 2012-09-02 MED ORDER — DOXYCYCLINE HYCLATE 100 MG PO TABS: 100.0000 mg | ORAL_TABLET | Freq: Two times a day (BID) | ORAL | Status: DC

## 2012-09-02 NOTE — Assessment & Plan Note (Signed)
51 year old white male with signs and symptoms of acute bronchitis. Treatment failure with Augmentin 875/125mg  x 7 days. I would like to use levaquin however there is risk of QT prolongation since he is on flecainide. Use doxycycline 100 mg twice daily for 10 days. Also use Flovent inhaler 2 puffs twice daily. Patient instructed to check with his cardiologist whether he can use Xopenex as bronchodilator.

## 2012-09-02 NOTE — Telephone Encounter (Signed)
Follow-up:    Patient returned your call.  Please call back. 

## 2012-09-02 NOTE — Telephone Encounter (Signed)
Saw PCP and has given ok to proceed with DCCV  I have called and changed the DCCV to Dr Tenny Craw from Dr Clifton James

## 2012-09-02 NOTE — Telephone Encounter (Signed)
Spoke with patient  He is going to get CXR now and Dr Johney Frame wants him to see his PCP.  I will call Dr Marliss Coots office  to have the patient seen today.  We are going to cx DCCV for tomorrow and have patient seen at PCP.  He will see Dr Artist Pais.  Patient aware.

## 2012-09-02 NOTE — Telephone Encounter (Signed)
F/u  Patient returning nurse call, he is very concerned as he has a procedure in the morning

## 2012-09-02 NOTE — Telephone Encounter (Signed)
Returning call lmom called other number and left message on that message also.

## 2012-09-02 NOTE — Progress Notes (Signed)
Subjective:    Patient ID: Derek Herrera, male    DOB: 21-Apr-1961, 51 y.o.   MRN: [id number]  HPI  51 year old white male with history of hypertension, atrial fibrillation and type 1 diabetes for followup regarding bronchitis. He was seen by his cardiologist who was in the process of scheduling cardioversion.  Patient complained of cough and wheezing. He was started on Augmentin 875/125 twice daily. He is on his 7th day of antibiotic treatment without any improvement. Patient reports both wife and children have similar illnesses. Their symptoms have improved however his cough seems to be getting worse.  He has been having "coughing fits" at work. He feels somewhat short of breath after bout of coughing. He denies fever or chills. He denies history of asthma. He has remote history of tobacco use.  CXR - personally reviewed.  No acute airspace dz.  No CHF  Review of Systems Negative for fever chills, mild sinus congestion  Past Medical History  Diagnosis Date  . DIABETES MELLITUS, TYPE I 06/29/2007  . HYPERTENSION 06/29/2007  . HYPERCHOLESTEROLEMIA 05/20/2010  . DM nephropathy/sclerosis   . Persistent atrial fibrillation   . Anemia   . Clotting disorder   . Obstructive sleep apnea     moderate by sleep study 11/13    History   Social History  . Marital Status: Married    Spouse Name: N/A    Number of Children: 5  . Years of Education: N/A   Occupational History  . 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 Smoking status: Never Smoker   . Smokeless tobacco: Not on file  . Alcohol Use: No  . Drug Use: No  . Sexually Active: Not Currently   Other Topics Concern  . Not on file   Social History Narrative  . No narrative on file    Past Surgical History  Procedure Date  . Knee surgery     x's 3  . Tee with cardioversion 08/02/2011    EF of approximately 50-55% conversion to sinus rhythm.  There were no apparent complications    Family History    Problem Relation Age of Onset  . Diabetes Maternal Grandmother   . Cancer Paternal Grandfather     No Known Allergies  Current Outpatient Prescriptions on File Prior to Visit  Medication Sig Dispense Refill  . diltiazem (CARDIZEM CD) 360 MG 24 hr capsule Take 1 capsule (360 mg total) by mouth daily.  90 capsule  3  . flecainide (TAMBOCOR) 100 MG tablet Take 1 tablet (100 mg total) by mouth 2 (two) times daily.  60 tablet  3  . glucose blood (ONE TOUCH ULTRA TEST) test strip Use as instructed five times daily dx 250.01  500 each  3  . guaiFENesin (MUCINEX) 600 MG 12 hr tablet Take 1,200 mg by mouth as needed.      Marland Kitchen guaifenesin (TUSSIN) 100 MG/5ML syrup Take 200 mg by mouth 3 (three) times daily as needed.      . insulin aspart (NOVOLOG FLEXPEN) 100 UNIT/ML injection Take 45 units with breakfast, and 95 units with the evening meal  45 mL  2  . insulin glargine (LANTUS) 100 UNIT/ML injection Inject 80 Units into the skin at bedtime.  30 mL  11  . Insulin Pen Needle (B-D ULTRAFINE III SHORT PEN) 31G X 8 MM MISC 1 each by Other route 2 (two) times daily.  150 each  1  .  losartan (COZAAR) 100 MG tablet Take 1 tablet (100 mg total) by mouth daily.  30 tablet  11  . Rivaroxaban (XARELTO) 20 MG TABS Take 20 mg by mouth daily.  30 tablet  10  . simvastatin (ZOCOR) 80 MG tablet Take 0.5 tablets (40 mg total) by mouth at bedtime.  15 tablet  5  . fluticasone (FLOVENT HFA) 110 MCG/ACT inhaler Inhale 2 puffs into the lungs 2 (two) times daily.  1 Inhaler  0    BP 104/90  Pulse 122  Temp 97.6 F (36.4 C) (Oral)  Wt 315 lb (142.883 kg)  SpO2 97%       Objective:   Physical Exam  Constitutional: He is oriented to person, place, and time.       Obese, pleasant 51 y/o male  HENT:  Head: Normocephalic and atraumatic.  Right Ear: External ear normal.  Left Ear: External ear normal.       Crowded oropharynx  Neck: Neck supple.       No neck tenderness  Cardiovascular: Normal rate, regular  rhythm and normal heart sounds.   Pulmonary/Chest: Effort normal. He has wheezes.  Musculoskeletal: He exhibits no edema.  Lymphadenopathy:    He has no cervical adenopathy.  Neurological: He is alert and oriented to person, place, and time.  Skin: Skin is warm and dry.  Psychiatric: He has a normal mood and affect. His behavior is normal.          Assessment & Plan:

## 2012-09-02 NOTE — Telephone Encounter (Signed)
Called and lmom for patient to call me today

## 2012-09-02 NOTE — Telephone Encounter (Signed)
F/u  Returning call back to nurse.  

## 2012-09-02 NOTE — Telephone Encounter (Signed)
F/u   Patient calling to speak with Tresa Endo concerning questions about procedure in the morning.

## 2012-09-03 ENCOUNTER — Ambulatory Visit (HOSPITAL_COMMUNITY)
Admission: RE | Admit: 2012-09-03 | Discharge: 2012-09-03 | Disposition: A | Discharge status: Home / Self Care | Payer: PRIVATE HEALTH INSURANCE | Source: Ambulatory Visit | Attending: Cardiovascular Disease | Admitting: Cardiovascular Disease

## 2012-09-03 ENCOUNTER — Encounter (HOSPITAL_COMMUNITY)
Admission: RE | Disposition: A | Discharge status: Home / Self Care | Payer: Self-pay | Source: Ambulatory Visit | Attending: Cardiovascular Disease

## 2012-09-03 ENCOUNTER — Encounter (HOSPITAL_COMMUNITY): Payer: Self-pay | Admitting: Anesthesiology

## 2012-09-03 ENCOUNTER — Other Ambulatory Visit: Payer: Self-pay

## 2012-09-03 ENCOUNTER — Encounter (HOSPITAL_COMMUNITY): Payer: Self-pay | Admitting: *Deleted

## 2012-09-03 ENCOUNTER — Ambulatory Visit (HOSPITAL_COMMUNITY): Payer: PRIVATE HEALTH INSURANCE | Admitting: Anesthesiology

## 2012-09-03 DIAGNOSIS — I4891 Unspecified atrial fibrillation: Secondary | ICD-10-CM

## 2012-09-03 DIAGNOSIS — E1029 Type 1 diabetes mellitus with other diabetic kidney complication: Secondary | ICD-10-CM | POA: Insufficient documentation

## 2012-09-03 DIAGNOSIS — R0602 Shortness of breath: Secondary | ICD-10-CM | POA: Insufficient documentation

## 2012-09-03 DIAGNOSIS — I4892 Unspecified atrial flutter: Secondary | ICD-10-CM

## 2012-09-03 DIAGNOSIS — I10 Essential (primary) hypertension: Secondary | ICD-10-CM | POA: Insufficient documentation

## 2012-09-03 DIAGNOSIS — N058 Unspecified nephritic syndrome with other morphologic changes: Secondary | ICD-10-CM | POA: Insufficient documentation

## 2012-09-03 HISTORY — PX: CARDIOVERSION: SHX1299

## 2012-09-03 LAB — GLUCOSE, CAPILLARY
Glucose-Capillary: 163 mg/dL — ABNORMAL HIGH (ref 70–99)
Glucose-Capillary: 216 mg/dL — ABNORMAL HIGH (ref 70–99)

## 2012-09-03 SURGERY — CARDIOVERSION
Anesthesia: Monitor Anesthesia Care | Wound class: Clean

## 2012-09-03 MED ORDER — DEXTROSE-NACL 5-0.45 % IV SOLN
INTRAVENOUS | Status: DC
  Administered 2012-09-03: 500 mL via INTRAVENOUS

## 2012-09-03 MED ORDER — SODIUM CHLORIDE 0.9 % IV SOLN
INTRAVENOUS | Status: DC | PRN
  Administered 2012-09-03: 11:00:00 via INTRAVENOUS

## 2012-09-03 MED ORDER — PROPOFOL 10 MG/ML IV BOLUS
INTRAVENOUS | Status: DC | PRN
  Administered 2012-09-03: 180 mg via INTRAVENOUS

## 2012-09-03 NOTE — Anesthesia Preprocedure Evaluation (Addendum)
Anesthesia Evaluation  Patient identified by MRN, date of birth, ID band Patient awake    Reviewed: Allergy & Precautions, H&P , NPO status , Patient's Chart, lab work & pertinent test results  History of Anesthesia Complications Negative for: history of anesthetic complications  Airway       Dental   Pulmonary shortness of breath and lying, sleep apnea , Recent URI , Residual Cough,  Moderate OSA no interventions in place at this time          Cardiovascular hypertension, Pt. on medications + dysrhythmias Atrial Fibrillation  Pt in a flutter   Ef 55%   Mild lvh, valves s significant regurg or stenosis   Neuro/Psych Diabetic neuropathy     GI/Hepatic   Endo/Other  diabetes, Poorly Controlled, Type 1, Insulin DependentMorbid obesity  Renal/GU Renal InsufficiencyRenal diseasenegative Renal ROS     Musculoskeletal   Abdominal   Peds  Hematology   Anesthesia Other Findings   Reproductive/Obstetrics                        Anesthesia Physical Anesthesia Plan  ASA: III  Anesthesia Plan: General   Post-op Pain Management:    Induction: Intravenous  Airway Management Planned: Mask  Additional Equipment:   Intra-op Plan:   Post-operative Plan:   Informed Consent:   Dental advisory given  Plan Discussed with: CRNA, Surgeon and Anesthesiologist  Anesthesia Plan Comments:        Anesthesia Quick Evaluation

## 2012-09-03 NOTE — Transfer of Care (Signed)
Immediate Anesthesia Transfer of Care Note  Patient: Derek Herrera  Procedure(s) Performed: Procedure(s) (LRB) with comments: CARDIOVERSION (N/A) - left message with leb RN to clarify if mcalhaney is doing this procedure/dl/10 2  Patient Location: PACU and Endoscopy Unit  Anesthesia Type: General  Level of Consciousness: awake, alert , oriented and patient cooperative  Airway & Oxygen Therapy: Patient Spontanous Breathing and Patient connected to nasal cannula oxygen  Post-op Assessment: Report given to PACU RN, Post -op Vital signs reviewed and stable and Patient moving all extremities  Post vital signs: Reviewed and stable  Complications: No apparent anesthesia complications

## 2012-09-03 NOTE — Interval H&P Note (Signed)
History and Physical Interval Note:  09/03/2012 10:43 AM  Derek Herrera  has presented today for surgery, with the diagnosis of a-flutter  The various methods of treatment have been discussed with the patient and family. After consideration of risks, benefits and other options for treatment, the patient has consented to  Procedure(s) (LRB) with comments: CARDIOVERSION (N/A) - left message with leb RN to clarify if mcalhaney is doing this procedure/dl/10 2 as a surgical intervention .  The patient's history has been reviewed, patient examined, no change in status, stable for surgery.  I have reviewed the patient's chart and labs.  Questions were answered to the patient's satisfaction.     Dietrich Pates

## 2012-09-03 NOTE — Op Note (Signed)
Patient anesthetized by anesthesia with 180 mg Propofol IV With pads in AP position patient cardioverted to SR with 200J synchronized biphasic energy.  12 lead EKG pending. Procedure without complication.

## 2012-09-03 NOTE — H&P (View-Only) (Signed)
Derek Herrera Date of Birth: 09/03/1961 Medical Record #[id number]  History of Present Illness: Derek Herrera is seen back today for an approximate 1 week check. He is seen for Dr. Johney Frame. He has atrial fib. Other issues include DM, HTN, HLD, nephropathy, anemia, cloting disorder and OSA. He was here on the 19th. Rate was elevated. He had had some bronchitis which may have been the trigger. His Diltiazem was increased.   He comes in today. He is here alone. He is still having issues with congestion, cough, bronchitis and is short of breath. Coughing up yellow sputum. Congested. Short of breath. Heart is still racing. He has been trying to work. He does get a little dizzy. No syncope. No chest pain.  He has been on his Xarelto. No missed doses. Everyone but his wife is sick at home and this is his 3rd week of it. Mild swelling if he is on his feet all day.   Current Outpatient Prescriptions on File Prior to Visit  Medication Sig Dispense Refill  . diltiazem (CARDIZEM CD) 360 MG 24 hr capsule Take 1 capsule (360 mg total) by mouth daily.  90 capsule  3  . glucose blood (ONE TOUCH ULTRA TEST) test strip Use as instructed five times daily dx 250.01  500 each  3  . insulin aspart (NOVOLOG FLEXPEN) 100 UNIT/ML injection Take 45 units with breakfast, and 95 units with the evening meal  45 mL  2  . insulin glargine (LANTUS) 100 UNIT/ML injection Inject 80 Units into the skin at bedtime.  30 mL  11  . Insulin Pen Needle (B-D ULTRAFINE III SHORT PEN) 31G X 8 MM MISC 1 each by Other route 2 (two) times daily.  150 each  1  . losartan (COZAAR) 100 MG tablet Take 1 tablet (100 mg total) by mouth daily.  30 tablet  11  . Rivaroxaban (XARELTO) 20 MG TABS Take 20 mg by mouth daily.  30 tablet  10  . simvastatin (ZOCOR) 80 MG tablet Take 0.5 tablets (40 mg total) by mouth at bedtime.  15 tablet  5  . flecainide (TAMBOCOR) 100 MG tablet Take 1 tablet (100 mg total) by mouth 2 (two) times daily.  60 tablet  3     No Known Allergies  Past Medical History  Diagnosis Date  . DIABETES MELLITUS, TYPE I 06/29/2007  . HYPERTENSION 06/29/2007  . HYPERCHOLESTEROLEMIA 05/20/2010  . DM nephropathy/sclerosis   . Persistent atrial fibrillation   . Anemia   . Clotting disorder   . Obstructive sleep apnea     moderate by sleep study 11/13    Past Surgical History  Procedure Date  . Knee surgery     x's 3  . Tee with cardioversion 08/02/2011    EF of approximately 50-55% conversion to sinus rhythm.  There were no apparent complications    History  Smoking status  . Never Smoker   Smokeless tobacco  . Not on file    History  Alcohol Use No    Family History  Problem Relation Age of Onset  . Diabetes Maternal Grandmother   . Cancer Paternal Grandfather     Review of Systems: The review of systems is per the HPI.  All other systems were reviewed and are negative.  Physical Exam: BP 118/86  Pulse 145  Ht 6\' 4"  (1.93 m)  Wt 314 lb 12.8 oz (142.792 kg)  BMI 38.32 kg/m2 Patient is very pleasant and in no acute distress.  He is of large build. Skin is warm and dry. Color is normal.  HEENT is unremarkable. Normocephalic/atraumatic. PERRL. Sclera are nonicteric. Neck is supple. No masses. No JVD. Lungs are clear. Cardiac exam shows an irregular rate and rhythm. He is tachycardic. Abdomen is obese but soft. Extremities are without edema. Gait and ROM are intact. No gross neurologic deficits noted.   LABORATORY DATA: EKG today shows atrial flutter with RVR. Tracing is reviewed with Dr. Johney Frame.  Lab Results  Component Value Date   WBC 9.3 08/03/2011   HGB 12.9* 08/03/2011   HCT 40.3 08/03/2011   PLT 292 08/03/2011   GLUCOSE 124* 08/03/2011   CHOL 169 08/01/2011   TRIG 112 08/01/2011   HDL 26* 08/01/2011   LDLCALC 121* 08/01/2011   ALT 24 07/31/2011   AST 23 07/31/2011   NA 138 08/03/2011   K 4.6 08/03/2011   CL 102 08/03/2011   CREATININE 0.67 08/03/2011   BUN 12 08/03/2011   CO2 31 08/03/2011   TSH 2.925  08/01/2011   HGBA1C 8.0* 03/23/2012   MICROALBUR 139.4* 05/20/2010     Assessment / Plan:  1. Atrial flutter with RVR - I have reviewed his case with Dr. Johney Frame. He would like to go ahead and start Flecainide 100 mg BID, obtain echo early next week and plan for cardioversion at the end of next week. Once we have restored sinus rhythm, he will need a stress myoview study. Labs are checked today. Cardioversion is planned for October 4th.   2. Bronchitis - will give him a course of Augmentin.   Patient is agreeable to this plan and will call if any problems develop in the interim.

## 2012-09-03 NOTE — Anesthesia Postprocedure Evaluation (Signed)
Anesthesia Post Note  Patient: Derek Herrera  Procedure(s) Performed: Procedure(s) (LRB): CARDIOVERSION (N/A)  Anesthesia type: General  Patient location: PACU  Post pain: Pain level controlled  Post assessment: Patient's Cardiovascular Status Stable  Last Vitals:  Filed Vitals:   09/03/12 1210  BP: 140/83  Temp:   Resp: 17    Post vital signs: Reviewed and stable  Level of consciousness: alert  Complications: No apparent anesthesia complications

## 2012-09-06 ENCOUNTER — Encounter (HOSPITAL_COMMUNITY): Payer: Self-pay | Admitting: Internal Medicine

## 2012-09-08 ENCOUNTER — Ambulatory Visit (INDEPENDENT_AMBULATORY_CARE_PROVIDER_SITE_OTHER): Payer: PRIVATE HEALTH INSURANCE | Admitting: Internal Medicine

## 2012-09-08 VITALS — BP 142/84 | HR 83 | Temp 98.3°F | Wt 311.0 lb

## 2012-09-08 DIAGNOSIS — J209 Acute bronchitis, unspecified: Secondary | ICD-10-CM

## 2012-09-08 MED ORDER — LEVALBUTEROL TARTRATE 45 MCG/ACT IN AERO: 2.0000 | INHALATION_SPRAY | Freq: Four times a day (QID) | RESPIRATORY_TRACT | Status: DC | PRN

## 2012-09-08 MED ORDER — SULFAMETHOXAZOLE-TMP DS 800-160 MG PO TABS: 1.0000 | ORAL_TABLET | Freq: Two times a day (BID) | ORAL | Status: DC

## 2012-09-08 NOTE — Assessment & Plan Note (Addendum)
Acute bronchitis slightly improved. Patient still having fits of coughing. He reports he may have been exposed to someone with pertussis 1 month ago. Change doxycycline to Bactrim DS twice daily for 10 days. We cannot use macrolides due to risk of QT prolongation since he is on flecainide. Continue Flovent inhaler. Add Xopenex 2 puffsevery 6 hours as needed.  Use of Xopenex inhaler was discussed with his cardiologist-Dr. Johney Frame. Reassess in 1 month.  Patient has history of intermittent GERD symptoms. This may be exacerbating factor. Consider starting proton pump inhibitor. Patient advised to sleep with head of bed raised at least 30.

## 2012-09-08 NOTE — Progress Notes (Signed)
Subjective:    Patient ID: Derek Herrera, male    DOB: 12/21/1960, 51 y.o.   MRN: [id number]  HPI  51 year old white male with history of atrial fib/flutter, diabetes type 1 and acute bronchitis for followup. At previous visit, patient's antibiotics  was changed from Augmentin to doxycycline 100 mg twice daily. Patient also started on Flovent inhaler. Patient reports bronchitis symptoms somewhat improved but not resolved. He continues to have fits of coughing especially at night and early morning.  Patient reports that he may of been exposed to someone who had pertussis 2-3 weeks ago.   Review of Systems Negative for fever or chills, mild dyspnea  Past Medical History  Diagnosis Date  . DIABETES MELLITUS, TYPE I 06/29/2007  . HYPERTENSION 06/29/2007  . HYPERCHOLESTEROLEMIA 05/20/2010  . DM nephropathy/sclerosis   . Persistent atrial fibrillation   . Anemia   . Clotting disorder   . Obstructive sleep apnea     moderate by sleep study 11/13    History   Social History  . Marital Status: Married    Spouse Name: N/A    Number of Children: 5  . Years of Education: N/A   Occupational History  . 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 Smoking status: Never Smoker   . Smokeless tobacco: Not on file  . Alcohol Use: No  . Drug Use: No  . Sexually Active: Not Currently   Other Topics Concern  . Not on file   Social History Narrative  . No narrative on file    Past Surgical History  Procedure Date  . Knee surgery     x's 3  . Tee with cardioversion 08/02/2011    EF of approximately 50-55% conversion to sinus rhythm.  There were no apparent complications  . Wrist fracture surgery right  . Cardioversion 09/03/2012    Procedure: CARDIOVERSION;  Surgeon: Pricilla Riffle, MD;  Location: [facility] ENDOSCOPY;  Service: Cardiovascular;  Laterality: N/A;  left message with leb RN to clarify if mcalhaney is doing this procedure/dl/10 2    Family History  Problem  Relation Age of Onset  . Diabetes Maternal Grandmother   . Cancer Paternal Grandfather     No Known Allergies  Current Outpatient Prescriptions on File Prior to Visit  Medication Sig Dispense Refill  . diltiazem (CARDIZEM CD) 360 MG 24 hr capsule Take 1 capsule (360 mg total) by mouth daily.  90 capsule  3  . flecainide (TAMBOCOR) 100 MG tablet Take 1 tablet (100 mg total) by mouth 2 (two) times daily.  60 tablet  3  . fluticasone (FLOVENT HFA) 110 MCG/ACT inhaler Inhale 2 puffs into the lungs 2 (two) times daily.  1 Inhaler  0  . glucose blood (ONE TOUCH ULTRA TEST) test strip Use as instructed five times daily dx 250.01  500 each  3  . guaiFENesin (MUCINEX) 600 MG 12 hr tablet Take 1,200 mg by mouth as needed.      Marland Kitchen guaifenesin (TUSSIN) 100 MG/5ML syrup Take 200 mg by mouth 3 (three) times daily as needed.      . insulin aspart (NOVOLOG FLEXPEN) 100 UNIT/ML injection Take 45 units with breakfast, and 95 units with the evening meal  45 mL  2  . insulin glargine (LANTUS) 100 UNIT/ML injection Inject 80 Units into the skin at bedtime.  30 mL  11  . Insulin Pen Needle (B-D ULTRAFINE III SHORT PEN) 31G X 8  MM MISC 1 each by Other route 2 (two) times daily.  150 each  1  . losartan (COZAAR) 100 MG tablet Take 1 tablet (100 mg total) by mouth daily.  30 tablet  11  . Rivaroxaban (XARELTO) 20 MG TABS Take 20 mg by mouth daily.  30 tablet  10  . simvastatin (ZOCOR) 80 MG tablet Take 0.5 tablets (40 mg total) by mouth at bedtime.  15 tablet  5  . levalbuterol (XOPENEX HFA) 45 MCG/ACT inhaler Inhale 2 puffs into the lungs every 6 (six) hours as needed for wheezing.  1 Inhaler  3    BP 142/84  Pulse 83  Temp 98.3 F (36.8 C) (Oral)  Wt 311 lb (141.069 kg)  SpO2 93%       Objective:   Physical Exam  Constitutional: He is oriented to person, place, and time. He appears well-developed and well-nourished.  Cardiovascular: Normal rate, regular rhythm and normal heart sounds.     Pulmonary/Chest: Effort normal.       Expiratory wheeze greater right than left  Musculoskeletal: He exhibits no edema.  Neurological: He is alert and oriented to person, place, and time.  Psychiatric: He has a normal mood and affect. His behavior is normal.          Assessment & Plan:

## 2012-09-09 ENCOUNTER — Ambulatory Visit: Payer: PRIVATE HEALTH INSURANCE | Admitting: Internal Medicine

## 2012-09-13 ENCOUNTER — Ambulatory Visit: Payer: PRIVATE HEALTH INSURANCE | Admitting: Nurse Practitioner

## 2012-09-13 ENCOUNTER — Other Ambulatory Visit: Payer: Self-pay | Admitting: Internal Medicine

## 2012-09-13 DIAGNOSIS — R05 Cough: Secondary | ICD-10-CM

## 2012-09-14 ENCOUNTER — Other Ambulatory Visit: Payer: Self-pay | Admitting: Cardiology

## 2012-09-14 DIAGNOSIS — R05 Cough: Secondary | ICD-10-CM

## 2012-09-14 MED ORDER — LOSARTAN POTASSIUM 100 MG PO TABS: 100.0000 mg | ORAL_TABLET | Freq: Every day | ORAL | Status: DC

## 2012-09-15 ENCOUNTER — Ambulatory Visit: Payer: PRIVATE HEALTH INSURANCE | Admitting: Internal Medicine

## 2012-09-20 ENCOUNTER — Ambulatory Visit (INDEPENDENT_AMBULATORY_CARE_PROVIDER_SITE_OTHER): Payer: PRIVATE HEALTH INSURANCE | Admitting: Pulmonary Disease

## 2012-09-20 ENCOUNTER — Ambulatory Visit (INDEPENDENT_AMBULATORY_CARE_PROVIDER_SITE_OTHER): Payer: PRIVATE HEALTH INSURANCE | Admitting: Internal Medicine

## 2012-09-20 ENCOUNTER — Encounter: Payer: Self-pay | Admitting: Pulmonary Disease

## 2012-09-20 VITALS — BP 124/76 | HR 79 | Temp 98.2°F | Ht 76.0 in | Wt 315.2 lb

## 2012-09-20 VITALS — BP 146/84 | HR 80 | Temp 98.0°F | Wt 311.0 lb

## 2012-09-20 DIAGNOSIS — R059 Cough, unspecified: Secondary | ICD-10-CM

## 2012-09-20 DIAGNOSIS — J209 Acute bronchitis, unspecified: Secondary | ICD-10-CM

## 2012-09-20 DIAGNOSIS — G4733 Obstructive sleep apnea (adult) (pediatric): Secondary | ICD-10-CM

## 2012-09-20 DIAGNOSIS — R05 Cough: Secondary | ICD-10-CM

## 2012-09-20 DIAGNOSIS — R0781 Pleurodynia: Secondary | ICD-10-CM

## 2012-09-20 DIAGNOSIS — R079 Chest pain, unspecified: Secondary | ICD-10-CM

## 2012-09-20 DIAGNOSIS — R053 Chronic cough: Secondary | ICD-10-CM

## 2012-09-20 MED ORDER — TRAMADOL HCL 50 MG PO TABS: 50.0000 mg | ORAL_TABLET | Freq: Three times a day (TID) | ORAL | Status: DC | PRN

## 2012-09-20 NOTE — Assessment & Plan Note (Signed)
Patient's lung exam has significantly improved. He has persistent cough and significant left rib tenderness. Obtain CT scan of chest to rule out persistent infectious process and rib fracture. Use tramadol 50 mg 3 times a day as directed. Reassess in 2 weeks.

## 2012-09-20 NOTE — Progress Notes (Signed)
Subjective:    Patient ID: Derek Herrera, male    DOB: [birth year], 51 y.o.   MRN: [id number]  HPI 51 y.o remote smoker referred for evaluation of OSA. He has atrial fibrillation & required cardioversion for the second time on 09/03/12 & is now on cardizem & flecainide to maintain nSR. The last episode was apparently triggered by acute bronchitis after exposure to dust, humidity & heat.  ESS 15/24 He has 6 children from 6 months to teenage years. Bedtime is 10-12 MN, latency 10 mins, sleeps on his side x 1 pillow - has been sleeping on a chair for a  Few days due to left lateral rib cage pain,CXR neg, CT chest is planned to r/o rib fractures due to coughing. He has 2 awakenings for nocturia, oob at 530 a tired, with dryness of mouth, denies headaches. He has gained 20 lbs in the last year. He drinks 2-6 cups of coffee daily. There is no history suggestive of cataplexy, sleep paralysis or parasomnias PSG 11/12 showed moderate OSa with AHI 22/h & nadir desatn of 73%, CPAP intervention was performed & events corrected at 12 cm with residual AHi of 4/h, satn 87%.   Past Medical History  Diagnosis Date  . DIABETES MELLITUS, TYPE I 06/29/2007  . HYPERTENSION 06/29/2007  . HYPERCHOLESTEROLEMIA 05/20/2010  . DM nephropathy/sclerosis   . Persistent atrial fibrillation   . Anemia   . Clotting disorder   . Obstructive sleep apnea     moderate by sleep study 11/13   Past Surgical History  Procedure Date  . Knee surgery     x's 3  . Tee with cardioversion 08/02/2011    EF of approximately 50-55% conversion to sinus rhythm.  There were no apparent complications  . Wrist fracture surgery right  . Cardioversion 09/03/2012    Procedure: CARDIOVERSION;  Surgeon: Pricilla Riffle, MD;  Location: [facility] ENDOSCOPY;  Service: Cardiovascular;  Laterality: N/A;  left message with leb RN to clarify if mcalhaney is doing this procedure/dl/10 2   No Known Allergies  History   Social History  . Marital Status:  Married    Spouse Name: N/A    Number of Children: 5  . Years of Education: N/A   Occupational History  . 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 Smoking status: Former Smoker -- 1.0 packs/day for 20 years    Types: Cigarettes    Quit date: 12/01/1990  . Smokeless tobacco: Not on file   Comment: smokked off an on x 20 yrs 09/20/12  . Alcohol Use: No  . Drug Use: No  . Sexually Active: Not Currently   Other Topics Concern  . Not on file   Social History Narrative  . No narrative on file     Review of Systems  Constitutional: Negative for appetite change and unexpected weight change.  HENT: Positive for congestion. Negative for ear pain, sore throat, sneezing, trouble swallowing and dental problem.   Respiratory: Positive for cough and shortness of breath.   Cardiovascular: Negative for chest pain, palpitations and leg swelling.  Gastrointestinal: Negative for abdominal pain.  Musculoskeletal: Negative for joint swelling.  Skin: Negative for rash.  Neurological: Negative for headaches.  Psychiatric/Behavioral: Negative for dysphoric mood. The patient is not nervous/anxious.        Objective:   Physical Exam   Gen. Pleasant, obese, in no distress, normal affect ENT - no lesions, no post nasal drip, class  2-3 airway Neck: No JVD, no thyromegaly, no carotid bruits Lungs: no use of accessory muscles, no dullness to percussion, decreased without rales or rhonchi  Cardiovascular: Rhythm regular, heart sounds  normal, no murmurs or gallops, no peripheral edema Abdomen: soft and non-tender, no hepatosplenomegaly, BS normal. Musculoskeletal: No deformities, no cyanosis or clubbing Neuro:  alert, non focal, no tremors      Assessment & Plan:

## 2012-09-20 NOTE — Assessment & Plan Note (Addendum)
The pathophysiology of obstructive sleep apnea , it's cardiovascular consequences & modes of treatment including CPAP were discused with the patient in detail & they evidenced understanding.   Start CPAP at 12 cm ,full face mask of choice, humidity, download in  weeks Weight loss encouraged, compliance with goal of at least 4-6 hrs every night is the expectation. Advised against medications with sedative side effects Cautioned against driving when sleepy - understanding that sleepiness will vary on a day to day basis

## 2012-09-20 NOTE — Progress Notes (Signed)
Subjective:    Patient ID: Derek Herrera, male    DOB: Dec 25, 1960, 51 y.o.   MRN: [id number]  HPI  51 year old white male with history of atrial fibrillation, sleep apnea and type 1 diabetes for followup. Patient previously seen for persistent bronchitis. His antibiotic was changed to Bactrim DS. Patient reports coughing has somewhat improved but not completely resolved. He continues to have significant left-sided rib pain that is worse with coughing or sneezing. He is concerned that he may have a rib fracture  Review of Systems Negative for shortness of breath.  Past Medical History  Diagnosis Date  . DIABETES MELLITUS, TYPE I 06/29/2007  . HYPERTENSION 06/29/2007  . HYPERCHOLESTEROLEMIA 05/20/2010  . DM nephropathy/sclerosis   . Persistent atrial fibrillation   . Anemia   . Clotting disorder   . Obstructive sleep apnea     moderate by sleep study 11/13    History   Social History  . Marital Status: Married    Spouse Name: N/A    Number of Children: 5  . Years of Education: N/A   Occupational History  . 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 Smoking status: Former Smoker -- 1.0 packs/day for 20 years    Types: Cigarettes    Quit date: 12/01/1990  . Smokeless tobacco: Not on file   Comment: smokked off an on x 20 yrs 09/20/12  . Alcohol Use: No  . Drug Use: No  . Sexually Active: Not Currently   Other Topics Concern  . Not on file   Social History Narrative  . No narrative on file    Past Surgical History  Procedure Date  . Knee surgery     x's 3  . Tee with cardioversion 08/02/2011    EF of approximately 50-55% conversion to sinus rhythm.  There were no apparent complications  . Wrist fracture surgery right  . Cardioversion 09/03/2012    Procedure: CARDIOVERSION;  Surgeon: Pricilla Riffle, MD;  Location: [facility] ENDOSCOPY;  Service: Cardiovascular;  Laterality: N/A;  left message with leb RN to clarify if mcalhaney is doing this  procedure/dl/10 2    Family History  Problem Relation Age of Onset  . Diabetes Maternal Grandmother   . Cancer Paternal Grandfather   . Bone cancer Maternal Grandmother     No Known Allergies  Current Outpatient Prescriptions on File Prior to Visit  Medication Sig Dispense Refill  . diltiazem (CARDIZEM CD) 360 MG 24 hr capsule Take 1 capsule (360 mg total) by mouth daily.  90 capsule  3  . flecainide (TAMBOCOR) 100 MG tablet Take 1 tablet (100 mg total) by mouth 2 (two) times daily.  60 tablet  3  . fluticasone (FLOVENT HFA) 110 MCG/ACT inhaler Inhale 2 puffs into the lungs 2 (two) times daily.  1 Inhaler  0  . glucose blood (ONE TOUCH ULTRA TEST) test strip Use as instructed five times daily dx 250.01  500 each  3  . insulin aspart (NOVOLOG FLEXPEN) 100 UNIT/ML injection Take 45 units with breakfast, and 95 units with the evening meal  45 mL  2  . insulin glargine (LANTUS) 100 UNIT/ML injection Inject 80 Units into the skin at bedtime.  30 mL  11  . Insulin Pen Needle (B-D ULTRAFINE III SHORT PEN) 31G X 8 MM MISC 1 each by Other route 2 (two) times daily.  150 each  1  . levalbuterol (XOPENEX HFA) 45 MCG/ACT inhaler  Inhale 2 puffs into the lungs every 6 (six) hours as needed for wheezing.  1 Inhaler  3  . losartan (COZAAR) 100 MG tablet Take 1 tablet (100 mg total) by mouth daily.  30 tablet  6  . Rivaroxaban (XARELTO) 20 MG TABS Take 20 mg by mouth daily.  30 tablet  10  . simvastatin (ZOCOR) 80 MG tablet Take 0.5 tablets (40 mg total) by mouth at bedtime.  15 tablet  5  . guaiFENesin (MUCINEX) 600 MG 12 hr tablet Take 1,200 mg by mouth as needed.      Marland Kitchen guaifenesin (TUSSIN) 100 MG/5ML syrup Take 200 mg by mouth 3 (three) times daily as needed.        BP 146/84  Pulse 80  Temp 98 F (36.7 C) (Oral)  Wt 311 lb (141.069 kg)  SpO2 97%       Objective:   Physical Exam  Constitutional: He appears well-developed and well-nourished.  HENT:  Head: Normocephalic and atraumatic.    Right Ear: External ear normal.  Left Ear: External ear normal.  Mouth/Throat: Oropharynx is clear and moist.  Cardiovascular: Normal rate, regular rhythm and normal heart sounds.   Pulmonary/Chest: Effort normal and breath sounds normal. He has no wheezes.       Left lateral rib tenderness (6th or 7th rib)          Assessment & Plan:

## 2012-09-20 NOTE — Patient Instructions (Signed)
We will set you up with a CPAP machine Set pressure at 12 cm , get download in 1 month

## 2012-09-21 ENCOUNTER — Encounter: Payer: Self-pay | Admitting: Nurse Practitioner

## 2012-09-21 ENCOUNTER — Ambulatory Visit (INDEPENDENT_AMBULATORY_CARE_PROVIDER_SITE_OTHER)
Admission: RE | Admit: 2012-09-21 | Discharge: 2012-09-21 | Disposition: A | Discharge status: Home / Self Care | Payer: PRIVATE HEALTH INSURANCE | Source: Ambulatory Visit | Attending: Internal Medicine | Admitting: Internal Medicine

## 2012-09-21 ENCOUNTER — Ambulatory Visit (INDEPENDENT_AMBULATORY_CARE_PROVIDER_SITE_OTHER): Payer: PRIVATE HEALTH INSURANCE | Admitting: Nurse Practitioner

## 2012-09-21 VITALS — BP 120/85 | HR 88 | Ht 76.0 in | Wt 314.1 lb

## 2012-09-21 DIAGNOSIS — I4891 Unspecified atrial fibrillation: Secondary | ICD-10-CM

## 2012-09-21 DIAGNOSIS — I519 Heart disease, unspecified: Secondary | ICD-10-CM

## 2012-09-21 DIAGNOSIS — I480 Paroxysmal atrial fibrillation: Secondary | ICD-10-CM

## 2012-09-21 DIAGNOSIS — R0781 Pleurodynia: Secondary | ICD-10-CM

## 2012-09-21 DIAGNOSIS — Z79899 Other long term (current) drug therapy: Secondary | ICD-10-CM

## 2012-09-21 DIAGNOSIS — R05 Cough: Secondary | ICD-10-CM

## 2012-09-21 DIAGNOSIS — R079 Chest pain, unspecified: Secondary | ICD-10-CM

## 2012-09-21 NOTE — Progress Notes (Signed)
Derek Herrera Date of Birth: Jul 05, 1961 Medical Record #[id number]  History of Present Illness: [name] is seen back today for a post cardioversion visit. He is seen for Dr. Johney Frame. He has had atrial fib. Past echo had showed some LV dysfunction in 2012 but was improved here earlier this month. Other issues include DM, HTN, HLD, nephropathy, anemia, clotting disorder and OSA. He has had a prolonged course of bronchitis.  He was started on Flecainide at his last visit with me. He remains on chronic anticoagulation. He was referred for cardioversion which was successful. It was Dr. Jenel Lucks plan to restore sinus rhythm and then proceed with a stress Myoview test.   He comes back today. He is feeling much better. He has had 3 rounds of antibiotics. Bronchitis is pretty much resolved but he has been left with significant left sided rib pain. It has persisted and he has had a CT earlier this afternoon. Those results are pending. His blood pressure at home has been good. Heart rate has been averaging 80's at home. No chest pain. He has also started CPAP. Not lightheaded or dizzy.   Current Outpatient Prescriptions on File Prior to Visit  Medication Sig Dispense Refill  . diltiazem (CARDIZEM CD) 360 MG 24 hr capsule Take 1 capsule (360 mg total) by mouth daily.  90 capsule  3  . flecainide (TAMBOCOR) 100 MG tablet Take 1 tablet (100 mg total) by mouth 2 (two) times daily.  60 tablet  3  . fluticasone (FLOVENT HFA) 110 MCG/ACT inhaler Inhale 2 puffs into the lungs 2 (two) times daily.  1 Inhaler  0  . glucose blood (ONE TOUCH ULTRA TEST) test strip Use as instructed five times daily dx 250.01  500 each  3  . guaiFENesin (MUCINEX) 600 MG 12 hr tablet Take 1,200 mg by mouth as needed.      Marland Kitchen guaifenesin (TUSSIN) 100 MG/5ML syrup Take 200 mg by mouth 3 (three) times daily as needed.      . insulin aspart (NOVOLOG FLEXPEN) 100 UNIT/ML injection Take 45 units with breakfast, and 95 units with the evening  meal  45 mL  2  . insulin glargine (LANTUS) 100 UNIT/ML injection Inject 80 Units into the skin at bedtime.  30 mL  11  . Insulin Pen Needle (B-D ULTRAFINE III SHORT PEN) 31G X 8 MM MISC 1 each by Other route 2 (two) times daily.  150 each  1  . levalbuterol (XOPENEX HFA) 45 MCG/ACT inhaler Inhale 2 puffs into the lungs every 6 (six) hours as needed for wheezing.  1 Inhaler  3  . losartan (COZAAR) 100 MG tablet Take 1 tablet (100 mg total) by mouth daily.  30 tablet  6  . Rivaroxaban (XARELTO) 20 MG TABS Take 20 mg by mouth daily.  30 tablet  10  . simvastatin (ZOCOR) 80 MG tablet Take 0.5 tablets (40 mg total) by mouth at bedtime.  15 tablet  5  . traMADol (ULTRAM) 50 MG tablet Take 1 tablet (50 mg total) by mouth every 8 (eight) hours as needed for pain.  30 tablet  1    No Known Allergies  Past Medical History  Diagnosis Date  . DIABETES MELLITUS, TYPE I 06/29/2007  . HYPERTENSION 06/29/2007  . HYPERCHOLESTEROLEMIA 05/20/2010  . DM nephropathy/sclerosis   . Persistent atrial fibrillation   . Anemia   . Clotting disorder   . Obstructive sleep apnea     moderate by sleep study 11/13  Past Surgical History  Procedure Date  . Knee surgery     x's 3  . Tee with cardioversion 08/02/2011    EF of approximately 50-55% conversion to sinus rhythm.  There were no apparent complications  . Wrist fracture surgery right  . Cardioversion 09/03/2012    Procedure: CARDIOVERSION;  Surgeon: Pricilla Riffle, MD;  Location: [facility] ENDOSCOPY;  Service: Cardiovascular;  Laterality: N/A;  left message with leb RN to clarify if mcalhaney is doing this procedure/dl/10 2    History  Smoking status  . Former Smoker -- 1.0 packs/day for 20 years  . Types: Cigarettes  . Quit date: 12/01/1990  Smokeless tobacco  . Not on file  Comment: smokked off an on x 20 yrs 09/20/12    History  Alcohol Use No    Family History  Problem Relation Age of Onset  . Diabetes Maternal Grandmother   . Cancer Paternal  Grandfather   . Bone cancer Maternal Grandmother     Review of Systems: The review of systems is per the HPI.  All other systems were reviewed and are negative.  Physical Exam: BP 120/85  Pulse 88  Ht 6\' 4"  (1.93 m)  Wt 314 lb 1.9 oz (142.484 kg)  BMI 38.24 kg/m2 Patient is very pleasant and in no acute distress. He is large in stature. He looks like he feels much better today. Skin is warm and dry. Color is normal.  HEENT is unremarkable. Normocephalic/atraumatic. PERRL. Sclera are nonicteric. Neck is supple. No masses. No JVD. Lungs are clear. Cardiac exam shows a regular rate and rhythm. Rate was in the mid 80's for me. Abdomen is soft. Extremities are without edema. Gait and ROM are intact. No gross neurologic deficits noted.  LABORATORY DATA:  EKG today shows sinus tachycardia. Rate is 101.   Assessment / Plan: 1. PAF - on Flecainide - back in sinus after cardioversion. He is better clinically. He will need stress testing but he is quite limited by this rib pain. I have scheduled his Myoview for about 3 weeks from now. No change in his medicines. He sees Dr. Johney Frame next month.   2. Chronic anticoagulation - may require long term anticoagulation. His CHADs is at least a 2 (HTN, DM). Will defer to Dr. Johney Frame.   3. HTN - repeat blood pressure by me today is down to 120/85. He will continue to monitor at home.   Patient is agreeable to this plan and will call if any problems develop in the interim.

## 2012-09-21 NOTE — Patient Instructions (Signed)
Stay on all your medicines  We will arrange for a stress test (stress Myoview) in about 3 weeks  See Dr. Johney Frame as planned.  Call the [facility] office at [phone] if you have any questions, problems or concerns.

## 2012-09-22 ENCOUNTER — Telehealth: Payer: Self-pay | Admitting: Internal Medicine

## 2012-09-22 DIAGNOSIS — R9389 Abnormal findings on diagnostic imaging of other specified body structures: Secondary | ICD-10-CM

## 2012-09-22 NOTE — Telephone Encounter (Signed)
Order placed in EPIC.

## 2012-09-22 NOTE — Telephone Encounter (Signed)
Pt states he is supposed to have a referral to oncology. There is no referral in epic for that. Please advise

## 2012-09-27 ENCOUNTER — Telehealth: Payer: Self-pay | Admitting: Internal Medicine

## 2012-09-27 NOTE — Telephone Encounter (Signed)
S/W pt wife in re NP appt 11/4 @ 9:30 w/ Dr. Arbutus Ped.  Referring Dr. Artist Pais Dx-Abn CT scan, Chest Welcome packet emailed.

## 2012-09-29 ENCOUNTER — Encounter: Payer: Self-pay | Admitting: Oncology

## 2012-09-29 ENCOUNTER — Other Ambulatory Visit: Payer: Self-pay | Admitting: Oncology

## 2012-09-29 DIAGNOSIS — M899 Disorder of bone, unspecified: Secondary | ICD-10-CM

## 2012-09-29 DIAGNOSIS — M898X9 Other specified disorders of bone, unspecified site: Secondary | ICD-10-CM

## 2012-09-29 HISTORY — DX: Disorder of bone, unspecified: M89.9

## 2012-09-29 HISTORY — DX: Other specified disorders of bone, unspecified site: M89.8X9

## 2012-09-30 ENCOUNTER — Telehealth: Payer: Self-pay | Admitting: Oncology

## 2012-09-30 NOTE — Telephone Encounter (Signed)
C/D 09/30/12 for appt. 10/04/12

## 2012-10-01 ENCOUNTER — Other Ambulatory Visit: Payer: Self-pay | Admitting: *Deleted

## 2012-10-01 MED ORDER — INSULIN ASPART 100 UNIT/ML ~~LOC~~ SOLN: SUBCUTANEOUS | Status: DC

## 2012-10-01 NOTE — Telephone Encounter (Signed)
Rx faxed to [facility] [phone].

## 2012-10-04 ENCOUNTER — Other Ambulatory Visit: Payer: PRIVATE HEALTH INSURANCE | Admitting: Lab

## 2012-10-04 ENCOUNTER — Other Ambulatory Visit (HOSPITAL_BASED_OUTPATIENT_CLINIC_OR_DEPARTMENT_OTHER): Payer: PRIVATE HEALTH INSURANCE | Admitting: Lab

## 2012-10-04 ENCOUNTER — Telehealth: Payer: Self-pay | Admitting: Oncology

## 2012-10-04 ENCOUNTER — Ambulatory Visit: Payer: PRIVATE HEALTH INSURANCE

## 2012-10-04 ENCOUNTER — Ambulatory Visit (HOSPITAL_COMMUNITY)
Admission: RE | Admit: 2012-10-04 | Discharge: 2012-10-04 | Disposition: A | Discharge status: Home / Self Care | Payer: PRIVATE HEALTH INSURANCE | Source: Ambulatory Visit | Attending: Oncology | Admitting: Oncology

## 2012-10-04 ENCOUNTER — Ambulatory Visit: Payer: PRIVATE HEALTH INSURANCE | Admitting: Internal Medicine

## 2012-10-04 ENCOUNTER — Encounter: Payer: Self-pay | Admitting: Oncology

## 2012-10-04 ENCOUNTER — Ambulatory Visit (HOSPITAL_BASED_OUTPATIENT_CLINIC_OR_DEPARTMENT_OTHER): Payer: PRIVATE HEALTH INSURANCE | Admitting: Oncology

## 2012-10-04 VITALS — BP 156/89 | HR 87 | Temp 97.6°F | Resp 22 | Ht 75.5 in | Wt 319.2 lb

## 2012-10-04 DIAGNOSIS — M899 Disorder of bone, unspecified: Secondary | ICD-10-CM | POA: Insufficient documentation

## 2012-10-04 DIAGNOSIS — R079 Chest pain, unspecified: Secondary | ICD-10-CM | POA: Insufficient documentation

## 2012-10-04 DIAGNOSIS — M5030 Other cervical disc degeneration, unspecified cervical region: Secondary | ICD-10-CM | POA: Insufficient documentation

## 2012-10-04 DIAGNOSIS — Z79899 Other long term (current) drug therapy: Secondary | ICD-10-CM

## 2012-10-04 DIAGNOSIS — M949 Disorder of cartilage, unspecified: Secondary | ICD-10-CM

## 2012-10-04 DIAGNOSIS — G4733 Obstructive sleep apnea (adult) (pediatric): Secondary | ICD-10-CM

## 2012-10-04 DIAGNOSIS — I4891 Unspecified atrial fibrillation: Secondary | ICD-10-CM

## 2012-10-04 DIAGNOSIS — R05 Cough: Secondary | ICD-10-CM

## 2012-10-04 LAB — COMPREHENSIVE METABOLIC PANEL (CC13)
ALT: 21 U/L (ref 0–55)
AST: 16 U/L (ref 5–34)
Albumin: 3.3 g/dL — ABNORMAL LOW (ref 3.5–5.0)
Alkaline Phosphatase: 113 U/L (ref 40–150)
BUN: 13 mg/dL (ref 7.0–26.0)
CO2: 26 meq/L (ref 22–29)
Calcium: 9.4 mg/dL (ref 8.4–10.4)
Chloride: 103 meq/L (ref 98–107)
Creatinine: 0.8 mg/dL (ref 0.7–1.3)
Glucose: 221 mg/dL — ABNORMAL HIGH (ref 70–99)
Potassium: 4.3 meq/L (ref 3.5–5.1)
Sodium: 136 meq/L (ref 136–145)
Total Bilirubin: 0.4 mg/dL (ref 0.20–1.20)
Total Protein: 6.7 g/dL (ref 6.4–8.3)

## 2012-10-04 LAB — CBC WITH DIFFERENTIAL/PLATELET
Basophils Absolute: 0.1 10*3/uL (ref 0.0–0.1)
Eosinophils Absolute: 0.3 10*3/uL (ref 0.0–0.5)
HCT: 41.5 % (ref 38.4–49.9)
HGB: 14 g/dL (ref 13.0–17.1)
MCH: 28.4 pg (ref 27.2–33.4)
MCV: 84.3 fL (ref 79.3–98.0)
MONO%: 7.5 % (ref 0.0–14.0)
NEUT#: 7.8 10*3/uL — ABNORMAL HIGH (ref 1.5–6.5)
NEUT%: 76.1 % — ABNORMAL HIGH (ref 39.0–75.0)
RDW: 14.3 % (ref 11.0–14.6)
lymph#: 1.3 10*3/uL (ref 0.9–3.3)

## 2012-10-04 LAB — LACTATE DEHYDROGENASE (CC13): LDH: 177 U/L (ref 125–220)

## 2012-10-04 NOTE — Patient Instructions (Signed)
Bone X-rays today Biopsy will be arranged if needed based on results of Xrays and labs

## 2012-10-04 NOTE — Telephone Encounter (Signed)
appts made and printed for pt pt sent over for bons survey

## 2012-10-04 NOTE — Progress Notes (Signed)
Checked in new patient. No financial issues. °

## 2012-10-04 NOTE — Progress Notes (Signed)
New Patient Hematology-Oncology Evaluation   Derek Herrera [id number] [birth year] 51 y.o. 10/04/2012  CC: Dr. Birdie Sons; Dr. Thomos Lemons.; Dr. Hillis Range; Dr. Cyril Mourning   Reason for referral: Lytic lesion seen left rib on recent CT chest   HPI:  Pleasant 51 year old man with a number of chronic medical problems likely related to underlying obstructive sleep apnea. Last September he developed acute atrial fibrillation/flutter and required cardioversion. He was anticoagulated with Xarelto and remains on anticoagulation at this time. Just a few weeks ago in early September of this year he required a second cardioversion procedure. He was found to have sleep apnea and placed on CPAP and is already seeing improvement in his overall status. 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 He has developed a chronic cough. He used to smoke but stopped 20 years ago. His initial problem with atrial fibrillation started with a viral-like illness and a cough one year ago. He had similar symptoms again this fall with a hacking cough which lasted for over 4 weeks. He was seen by Dr. Vassie Loll. He was treated with antibiotics and bronchodilators. Routine chest radiograph did not show any obvious infiltrate or effusion. No mass. He began to develop persistent left lateral chest pain. A CT scan of the chest was ordered and done on 09/21/2012. This was remarkable for an expansile lytic mass lesion involving the left lateral seventh rib with an associated soft tissue component. There were no pulmonary parenchymal lesions and no hilar or mediastinal lymphadenopathy.  He has no other areas of pain. He has not been anemic and recent CBCs have been normal including the one done in our office today. He has no history of any prostate problems. No family history of malignancy.  He has no constitutional symptoms and if anything his weight has been rising. He has had no change in bowel habit. No  hematochezia, melena, or hematuria. He had a colonoscopy about one year ago and was told this was normal except for a few benign polyps.   PMH: Past Medical History  Diagnosis Date  . DIABETES MELLITUS, TYPE I 06/29/2007  . HYPERTENSION 06/29/2007  . HYPERCHOLESTEROLEMIA 05/20/2010  . DM nephropathy/sclerosis   . Persistent atrial fibrillation   . Anemia   . Clotting disorder   . Obstructive sleep apnea     moderate by sleep study 11/13  . Lytic lesion of bone on x-ray 09/29/2012    Expansile lesion Left lateral rib on CT 09/23/12  Chest otherwise negative  He has been diabetic for over 15 years and on insulin for the last 7 years. Most recent echocardiogram done 08/31/2012 showed an ejection fraction of 55% with mild left ventricular hypertrophy. 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 No history of ulcers, emphysema, asthma, hepatitis, yellow jaundice, mononucleosis, thyroid disease, seizure, stroke, or blood clots. No arthritis condition.  Past Surgical History  Procedure Date  . Knee surgery     x's 3  . Tee with cardioversion 08/02/2011    EF of approximately 50-55% conversion to sinus rhythm.  There were no apparent complications  . Wrist fracture surgery right  . Cardioversion 09/03/2012    Procedure: CARDIOVERSION;  Surgeon: Pricilla Riffle, MD;  Location: [facility] ENDOSCOPY;  Service: Cardiovascular;  Laterality: N/A;  left message with leb RN to clarify if mcalhaney is doing this procedure/dl/10 2    Allergies: No  Known Allergies  Medications: Cardizem CD 360 mg daily; Tambocor 100 mg twice a day; Flovent HFA 110 mcg inhaler 2 puffs twice a day; NovoLog insulin 100 units per mL 45 units with breakfast 95 units in the evening; Lantus insulin 80 units at bedtime; Xopenex 45 mcg 2 puffs every 6 hours when necessary; Cozaar 100 mg daily; Xarelto 20 mg daily; Zocor 80 mg one half tablet  at bedtime; tramadol 50 mg q. 8 hours when necessary pain  Social History: He is married. I take care of his wife who has a congenital coagulopathy but has had 6 successful pregnancies without any blood clots. He works in Teacher, English as a foreign language. No known exposure to organic solvents or asbestos.  reports that he quit smoking about 21 years ago. His smoking use included Cigarettes. He has a 20 pack-year smoking history. He does not have any smokeless tobacco history on file. He reports that he does not drink alcohol or use illicit drugs.  Family History: Family History  Problem Relation Age of Onset  . Diabetes Maternal Grandmother   . Cancer Paternal Grandfather   . Bone cancer Maternal Grandmother   Mother is under active treatment for vasculitis she is 29 years old. Father was a state trooper died in a motor vehicle accident when the patient was only a infant. He has one older sister who is in good health.  Review of Systems: Constitutional symptoms: No constitutional symptoms HEENT: No sore throat or dysphagia Respiratory: See above Cardiovascular:  See above Gastrointestinal ROS: See above Genito-Urinary ROS: See above Hematological and Lymphatic: See above Musculoskeletal: See above Neurologic: No headache or change in vision. No focal motor weakness. No paresthesias. Dermatologic: No rash or ecchymoses Remaining ROS negative.  Physical Exam: Blood pressure 156/89, pulse 87, temperature 97.6 F (36.4 C), temperature source Oral, resp. rate 22, height 6' 3.5" (1.918 m), weight 319 lb 3.2 oz (144.788 kg). Wt Readings from Last 3 Encounters:  10/04/12 319 lb 3.2 oz (144.788 kg)  09/21/12 314 lb 1.9 oz (142.484 kg)  09/20/12 315 lb 3.2 oz (142.974 kg)    General appearance: Tall, obese, Caucasian man  Head: Normal Neck: Motion Lymph nodes: No lymphadenopathy Breasts: No gross mass Lungs: Clear to auscultation resonant to percussion Heart: Regular rhythm no murmur Abdominal:  Soft, nontender, no mass organomegaly GU: Rectal exam resting flat no nodules stool brown guaiac negative Extremities: No edema, no calf tenderness Neurologic: Mental status and,, motor strength 5 over 5, reflexes 1+ symmetric coordination normal; significant decrease in vibration sensation by tuning fork exam over the fingertips Skin: No rash or ecchymosis    Lab Results: Lab Results  Component Value Date   WBC 10.3 10/04/2012   HGB 14.0 10/04/2012   HCT 41.5 10/04/2012   MCV 84.3 10/04/2012   PLT 231 10/04/2012     Chemistry      Component Value Date/Time   NA 136 08/27/2012 1610   K 4.6 08/27/2012 1610   CL 103 08/27/2012 1610   CO2 28 08/27/2012 1610   BUN 15 08/27/2012 1610   CREATININE 0.81 08/27/2012 1610   CREATININE 0.67 08/03/2011 0450      Component Value Date/Time   CALCIUM 9.0 08/27/2012 1610   ALKPHOS 86 07/31/2011 1356   AST 23 07/31/2011 1356   ALT 24 07/31/2011 1356   BILITOT 0.5 07/31/2011 1356        Radiological Studies: See discussion above    Impression and Plan: Isolated lytic lesion left seventh rib. No  anemia. No history of prostate trouble and normal prostate exam. Normal colonoscopy one year ago.  Before we call this a metastatic malignancy, we need some more information. I'm going to get a skeletal survey today. I am ordering serum quantitative immunoglobulins with immunofixation electrophoresis. If these studies are unrevealing then I will arrange for an outpatient CT-guided needle biopsy of the rib lesion. This could represent an isolated plasmacytoma.      Levert Feinstein, MD 10/04/2012, 12:14 PM

## 2012-10-06 ENCOUNTER — Other Ambulatory Visit: Payer: Self-pay | Admitting: Oncology

## 2012-10-06 ENCOUNTER — Telehealth: Payer: Self-pay | Admitting: Oncology

## 2012-10-06 DIAGNOSIS — M899 Disorder of bone, unspecified: Secondary | ICD-10-CM

## 2012-10-06 LAB — IMMUNOFIXATION ELECTROPHORESIS
IgA: 557 mg/dL — ABNORMAL HIGH (ref 68–379)
IgM, Serum: 102 mg/dL (ref 41–251)

## 2012-10-06 LAB — KAPPA/LAMBDA LIGHT CHAINS
Kappa free light chain: 2.24 mg/dL — ABNORMAL HIGH (ref 0.33–1.94)
Kappa:Lambda Ratio: 1.09 (ref 0.26–1.65)
Lambda Free Lght Chn: 2.06 mg/dL (ref 0.57–2.63)

## 2012-10-06 LAB — PSA: PSA: 0.21 ng/mL (ref <=4.00)

## 2012-10-06 NOTE — Telephone Encounter (Signed)
The patient I saw on November 4 under evaluation for what appears to be a lytic lesion in the seventh left rib. I called him with the results of testing today. Skeletal bone survey shows no other lesions. PSA is normal. Serum immunoglobulins showed slight elevation of IgA but no monoclonal protein. Serum free kappa and lambda light chain ratio is normal. At this point I think that we need to arrange a biopsy of the rib lesion. I will do this with the assistance of interventional radiology.

## 2012-10-07 ENCOUNTER — Other Ambulatory Visit: Payer: Self-pay | Admitting: *Deleted

## 2012-10-11 ENCOUNTER — Other Ambulatory Visit: Payer: Self-pay | Admitting: Oncology

## 2012-10-12 ENCOUNTER — Ambulatory Visit: Payer: PRIVATE HEALTH INSURANCE | Admitting: Oncology

## 2012-10-12 ENCOUNTER — Encounter (HOSPITAL_COMMUNITY): Payer: PRIVATE HEALTH INSURANCE

## 2012-10-13 ENCOUNTER — Ambulatory Visit (HOSPITAL_COMMUNITY): Payer: PRIVATE HEALTH INSURANCE | Attending: Cardiovascular Disease | Admitting: Radiology

## 2012-10-13 VITALS — BP 131/88 | Ht 75.0 in | Wt 314.0 lb

## 2012-10-13 DIAGNOSIS — R0602 Shortness of breath: Secondary | ICD-10-CM

## 2012-10-13 DIAGNOSIS — R079 Chest pain, unspecified: Secondary | ICD-10-CM | POA: Insufficient documentation

## 2012-10-13 DIAGNOSIS — Z79899 Other long term (current) drug therapy: Secondary | ICD-10-CM

## 2012-10-13 DIAGNOSIS — R002 Palpitations: Secondary | ICD-10-CM | POA: Insufficient documentation

## 2012-10-13 DIAGNOSIS — I519 Heart disease, unspecified: Secondary | ICD-10-CM

## 2012-10-13 DIAGNOSIS — E785 Hyperlipidemia, unspecified: Secondary | ICD-10-CM | POA: Insufficient documentation

## 2012-10-13 DIAGNOSIS — E119 Type 2 diabetes mellitus without complications: Secondary | ICD-10-CM | POA: Insufficient documentation

## 2012-10-13 DIAGNOSIS — Z794 Long term (current) use of insulin: Secondary | ICD-10-CM | POA: Insufficient documentation

## 2012-10-13 DIAGNOSIS — I10 Essential (primary) hypertension: Secondary | ICD-10-CM | POA: Insufficient documentation

## 2012-10-13 DIAGNOSIS — Z87891 Personal history of nicotine dependence: Secondary | ICD-10-CM | POA: Insufficient documentation

## 2012-10-13 DIAGNOSIS — I4891 Unspecified atrial fibrillation: Secondary | ICD-10-CM | POA: Insufficient documentation

## 2012-10-13 MED ORDER — TECHNETIUM TC 99M SESTAMIBI GENERIC - CARDIOLITE
33.0000 | Freq: Once | INTRAVENOUS | Status: AC | PRN
  Administered 2012-10-13: 33 via INTRAVENOUS

## 2012-10-13 MED ORDER — REGADENOSON 0.4 MG/5ML IV SOLN
0.4000 mg | Freq: Once | INTRAVENOUS | Status: AC
  Administered 2012-10-13: 0.4 mg via INTRAVENOUS

## 2012-10-13 NOTE — Progress Notes (Signed)
[name] SITE 3 NUCLEAR MED [address] [id number] [name] [address] [phone]  Cardiology Nuclear Med Study  Derek Herrera is a 51 y.o. male     MRN : [id number]     DOB: 12-Jul-1961  Procedure Date: 10/13/2012  Nuclear Med Background Indication for Stress Test:  Evaluation for Ischemia and assess for ventricular arrhythmias (recently started Flecainide) History:  Afib; 09/03/12 Cardioversion; Echo-EF 55%, mild LVH; OSA Cardiac Risk Factors: History of Smoking, Hypertension, IDDM Type 1 and Lipids  Symptoms:  Chest Pain and Palpitations   Nuclear Pre-Procedure Caffeine/Decaff Intake:  None NPO After: 8:00am   Lungs:  clear O2 Sat: 96% on room air. IV 0.9% NS with Angio Cath:  24g  IV Site: L Hand  IV Started by:  Frederick Peers, EMT-P  Chest Size (in):  54 Cup Size: n/a  Height: 6\' 3"  (1.905 m)  Weight:  314 lb (142.429 kg)  BMI:  Body mass index is 39.25 kg/(m^2). Tech Comments:  Took insulin with bfast    Nuclear Med Study 1 or 2 day study: 2 day  Stress Test Type:  Treadmill/Lexiscan  Reading MD: [name], MD  Order Authorizing Provider:  J. Allred, MD  Resting Radionuclide: Technetium 90m Sestamibi  Resting Radionuclide Dose: 33.0 mCi on 10/18/12   Stress Radionuclide:  Technetium 28m Sestamibi  Stress Radionuclide Dose: 33.0 mCi on 10/13/12           Stress Protocol Rest HR: 107 Stress HR: 115  Rest BP: 168/73 Stress BP: 115/65  Exercise Time (min): 11:27 METS: 10.20   Predicted Max HR: 168 bpm % Max HR: 68.45 bpm Rate Pressure Product: 25366   Dose of Adenosine (mg):  n/a Dose of Lexiscan: 0.4 mg  Dose of Atropine (mg): n/a Dose of Dobutamine: n/a mcg/kg/min (at max HR)  Stress Test Technologist: Frederick Peers, EMT-P  Nuclear Technologist:  Domenic Polite, CNMT     Rest Procedure:  Myocardial perfusion imaging was performed at rest 45 minutes following the intravenous administration of Technetium 96m  Sestamibi. Rest ECG: NSR - Normal EKG  Stress Procedure:  The patient received IV Lexiscan 0.4 mg over 15-seconds.  Technetium 60m Sestamibi injected at 30-seconds.  There were no significant changes  and a rare pvc with Lexiscan.  Quantitative spect images were obtained after a 45 minute delay. Stress ECG: No significant change from baseline ECG  QPS Raw Data Images:  Normal; no motion artifact; normal heart/lung ratio. Stress Images:  There is interference from nuclear activity from structures below the diaphragm. This makes it difficult to assess the base of the inferior wall. Rest Images:  There is interference from nuclear activity from structures below the diaphragm. On these rest images this area of interference is separated from the base of the inferior wall. It gives the impression that there is decreased activity at the base of the inferior wall. Subtraction (SDS):  Quantitative analysis may be fold by the activity for structures below the diaphragm. No obvious ischemia is seen by standard criteria. Transient Ischemic Dilatation (Normal <1.22):  0.92 Lung/Heart Ratio (Normal <0.45):  0.40  Quantitative Gated Spect Images QGS EDV:  139 ml QGS ESV:  71 ml  Impression Exercise Capacity:  The patient appeared to be exercising relatively well. However at peak stress the patient jumped off the treadmill. There was no injury. The IV was infiltrated. A new IV was placed and the patient received Lexiscan. BP Response:  Normal blood pressure response. Clinical  Symptoms:  shortness of breath ECG Impression:  No significant ST segment change suggestive of ischemia. Comparison with Prior Nuclear Study: No images to compare  Overall Impression:  The study is difficult to assess. There is interference from nuclear activity for structures below the diaphragm. This affects the ability to fully assess the base of the inferior wall. The impression is given that there may be scar at the base of the  inferior wall with a wall motion abnormality. No definite ischemia is seen. It is possible that some of these impressions are artifactual. There was no significant ectopy with the patient walking on the treadmill.  LV Ejection Fraction: 49%.  LV Wall Motion:  There is hypokinesis at the base of the inferior wall and the base of the septum.  Willa Rough, MD

## 2012-10-14 ENCOUNTER — Telehealth: Payer: Self-pay | Admitting: Oncology

## 2012-10-14 ENCOUNTER — Telehealth: Payer: Self-pay | Admitting: Internal Medicine

## 2012-10-14 MED ORDER — TRAMADOL HCL 50 MG PO TABS: 50.0000 mg | ORAL_TABLET | Freq: Three times a day (TID) | ORAL | Status: DC | PRN

## 2012-10-14 NOTE — Telephone Encounter (Signed)
S/w Brett Canales @ [facility] yesterday for help w/bx order and getting pt on schedule. Problem with bx order. Pt is now on schedule for 11/20. appt was scheduled by central and central will contact pt re appt. No other orders.

## 2012-10-14 NOTE — Telephone Encounter (Signed)
Pt called req to get refill of traMADol (ULTRAM) 50 MG tablet take 1 tablet by mouth every 8hrs prn for pain.  Pt said that 30 pills only last 10 days. Pt said that ribs are still very painful. Pls call in refill to [facility]. Pt req call back when done.

## 2012-10-14 NOTE — Telephone Encounter (Signed)
Ok per Dr Artist Pais for #[id number], rx sent in electronically, pt aware

## 2012-10-15 ENCOUNTER — Other Ambulatory Visit: Payer: Self-pay | Admitting: Oncology

## 2012-10-15 ENCOUNTER — Telehealth: Payer: Self-pay | Admitting: Oncology

## 2012-10-15 NOTE — Telephone Encounter (Signed)
Called patient and left message regarding appt 10/26/12 MD only

## 2012-10-18 ENCOUNTER — Ambulatory Visit (HOSPITAL_COMMUNITY): Payer: PRIVATE HEALTH INSURANCE | Attending: Cardiology | Admitting: Radiology

## 2012-10-18 DIAGNOSIS — R0989 Other specified symptoms and signs involving the circulatory and respiratory systems: Secondary | ICD-10-CM

## 2012-10-18 MED ORDER — TECHNETIUM TC 99M SESTAMIBI GENERIC - CARDIOLITE
33.0000 | Freq: Once | INTRAVENOUS | Status: AC | PRN
  Administered 2012-10-18: 33 via INTRAVENOUS

## 2012-10-19 ENCOUNTER — Encounter (HOSPITAL_COMMUNITY): Payer: Self-pay | Admitting: Pharmacy Technician

## 2012-10-19 ENCOUNTER — Other Ambulatory Visit: Payer: Self-pay | Admitting: Radiology

## 2012-10-20 ENCOUNTER — Encounter (HOSPITAL_COMMUNITY): Payer: Self-pay

## 2012-10-20 ENCOUNTER — Ambulatory Visit (HOSPITAL_COMMUNITY)
Admission: RE | Admit: 2012-10-20 | Discharge: 2012-10-20 | Disposition: A | Discharge status: Home / Self Care | Payer: PRIVATE HEALTH INSURANCE | Source: Ambulatory Visit | Attending: Oncology | Admitting: Oncology

## 2012-10-20 ENCOUNTER — Telehealth: Payer: Self-pay | Admitting: Internal Medicine

## 2012-10-20 VITALS — BP 123/76 | HR 72 | Temp 97.4°F | Resp 16 | Ht 75.0 in | Wt 312.0 lb

## 2012-10-20 DIAGNOSIS — M949 Disorder of cartilage, unspecified: Secondary | ICD-10-CM | POA: Insufficient documentation

## 2012-10-20 DIAGNOSIS — M899 Disorder of bone, unspecified: Secondary | ICD-10-CM

## 2012-10-20 LAB — CBC
Platelets: 266 10*3/uL (ref 150–400)
RDW: 14.4 % (ref 11.5–15.5)
WBC: 9 10*3/uL (ref 4.0–10.5)

## 2012-10-20 LAB — APTT: aPTT: 34 seconds (ref 24–37)

## 2012-10-20 LAB — PROTIME-INR: INR: 0.98 (ref 0.00–1.49)

## 2012-10-20 LAB — GLUCOSE, CAPILLARY: Glucose-Capillary: 174 mg/dL — ABNORMAL HIGH (ref 70–99)

## 2012-10-20 MED ORDER — SODIUM CHLORIDE 0.9 % IV SOLN: Freq: Once | INTRAVENOUS | Status: DC

## 2012-10-20 MED ORDER — MIDAZOLAM HCL 2 MG/2ML IJ SOLN
INTRAMUSCULAR | Status: AC
  Filled 2012-10-20: qty 4

## 2012-10-20 MED ORDER — MIDAZOLAM HCL 2 MG/2ML IJ SOLN
INTRAMUSCULAR | Status: AC | PRN
  Administered 2012-10-20: 2 mg via INTRAVENOUS

## 2012-10-20 MED ORDER — FENTANYL CITRATE 0.05 MG/ML IJ SOLN
INTRAMUSCULAR | Status: AC
  Filled 2012-10-20: qty 4

## 2012-10-20 MED ORDER — FENTANYL CITRATE 0.05 MG/ML IJ SOLN
INTRAMUSCULAR | Status: AC | PRN
  Administered 2012-10-20: 100 ug via INTRAVENOUS

## 2012-10-20 NOTE — H&P (Signed)
Derek Herrera is an 51 y.o. male.   Chief Complaint: "I'm here for a rib biopsy" HPI: Patient with history of left lateral chest pain and recent CT revealing an expansile lytic lesion in left lateral seventh rib. He presents today for CT guided biopsy of the left lateral seventh rib lesion.  Past Medical History  Diagnosis Date  . DIABETES MELLITUS, TYPE I 06/29/2007  . HYPERTENSION 06/29/2007  . HYPERCHOLESTEROLEMIA 05/20/2010  . DM nephropathy/sclerosis   . Persistent atrial fibrillation   . Anemia   . Clotting disorder   . Obstructive sleep apnea     moderate by sleep study 11/13  . Lytic lesion of bone on x-ray 09/29/2012    Expansile lesion Left lateral rib on CT 09/23/12  Chest otherwise negative    Past Surgical History  Procedure Date  . Knee surgery     x's 3  . Tee with cardioversion 08/02/2011    EF of approximately 50-55% conversion to sinus rhythm.  There were no apparent complications  . Wrist fracture surgery right  . Cardioversion 09/03/2012    Procedure: CARDIOVERSION;  Surgeon: Pricilla Riffle, MD;  Location: [facility] ENDOSCOPY;  Service: Cardiovascular;  Laterality: N/A;  left message with leb RN to clarify if mcalhaney is doing this procedure/dl/10 2    Family History  Problem Relation Age of Onset  . Diabetes Maternal Grandmother   . Cancer Paternal Grandfather   . Bone cancer Maternal Grandmother    Social History:  reports that he quit smoking about 21 years ago. His smoking use included Cigarettes. He has a 20 pack-year smoking history. He does not have any smokeless tobacco history on file. He reports that he does not drink alcohol or use illicit drugs.  Allergies: No Known Allergies  Current outpatient prescriptions:diltiazem (CARDIZEM CD) 360 MG 24 hr capsule, Take 360 mg by mouth daily after lunch., Disp: , Rfl: ;  flecainide (TAMBOCOR) 100 MG tablet, Take 1 tablet (100 mg total) by mouth 2 (two) times daily., Disp: 60 tablet, Rfl: 3;  insulin aspart (NOVOLOG  FLEXPEN) 100 UNIT/ML injection, Take 45 units with breakfast, and 95 units with the evening meal, Disp: 45 mL, Rfl: 1 insulin glargine (LANTUS) 100 UNIT/ML injection, Inject 80 Units into the skin at bedtime., Disp: 30 mL, Rfl: 11;  levalbuterol (XOPENEX HFA) 45 MCG/ACT inhaler, Inhale 2 puffs into the lungs every 6 (six) hours as needed for wheezing., Disp: 1 Inhaler, Rfl: 3;  losartan (COZAAR) 100 MG tablet, Take 100 mg by mouth every evening., Disp: , Rfl:  traMADol (ULTRAM) 50 MG tablet, Take 1 tablet (50 mg total) by mouth every 8 (eight) hours as needed for pain., Disp: 60 tablet, Rfl: 1;  Rivaroxaban (XARELTO) 20 MG TABS, Take 20 mg by mouth daily., Disp: 30 tablet, Rfl: 10;  simvastatin (ZOCOR) 80 MG tablet, Take 0.5 tablets (40 mg total) by mouth at bedtime., Disp: 15 tablet, Rfl: 5 Current facility-administered medications:0.9 %  sodium chloride infusion, , Intravenous, Once, Brayton El, PA;  fentaNYL (SUBLIMAZE) 0.05 MG/ML injection, , , , ;  midazolam (VERSED) 2 MG/2ML injection, , , ,    Results for orders placed during the hospital encounter of 10/20/12 (from the past 48 hour(s))  APTT     Status: Normal   Collection Time   10/20/12 10:10 AM      Component Value Range Comment   aPTT 34  24 - 37 seconds   CBC     Status: Normal  Collection Time   10/20/12 10:10 AM      Component Value Range Comment   WBC 9.0  4.0 - 10.5 K/uL    RBC 4.72  4.22 - 5.81 MIL/uL    Hemoglobin 13.3  13.0 - 17.0 g/dL    HCT 16.1  09.6 - 04.5 %    MCV 83.1  78.0 - 100.0 fL    MCH 28.2  26.0 - 34.0 pg    MCHC 33.9  30.0 - 36.0 g/dL    RDW 40.9  81.1 - 91.4 %    Platelets 266  150 - 400 K/uL   PROTIME-INR     Status: Normal   Collection Time   10/20/12 10:10 AM      Component Value Range Comment   Prothrombin Time 12.9  11.6 - 15.2 seconds    INR 0.98  0.00 - 1.49    No results found.  Review of Systems  Constitutional: Negative for fever and chills.  Respiratory: Positive for cough.  Negative for shortness of breath.   Cardiovascular:       Left lateral chest pain  Gastrointestinal: Negative for nausea, vomiting and abdominal pain.  Musculoskeletal: Negative for back pain.  Neurological: Negative for headaches.  Endo/Heme/Allergies: Does not bruise/bleed easily.    Blood pressure 152/94, pulse 74, temperature 98.4 F (36.9 C), temperature source Oral, resp. rate 18, height 6\' 3"  (1.905 m), weight 312 lb (141.522 kg), SpO2 98.00%. Physical Exam  Constitutional: He appears well-developed and well-nourished.  Cardiovascular: Normal rate and regular rhythm.   Respiratory: Effort normal and breath sounds normal.  GI: Soft. Bowel sounds are normal. There is no tenderness.       obese  Musculoskeletal: Normal range of motion. He exhibits no edema.     Assessment/Plan Pt with hx of left lateral chest pain and recent CT revealing an expansile left lateral seventh rib lesion. Plan is for CT guided biopsy of the left seventh rib lesion today. Details/risks of procedure d/w pt/wife with their understanding and consent.  Cassy Sprowl,D KEVIN 10/20/2012, 11:02 AM

## 2012-10-20 NOTE — Procedures (Signed)
Technically successful CT guided biopsy of left rib lesion.  No immediate complications.

## 2012-10-20 NOTE — Telephone Encounter (Signed)
New problem:   Patient calling wanted to discuss his appt on tomorrow with Dr. Johney Frame.  Stated  he has somewhere to be on tomorrow. Patient, ask if Dr. Johney Frame would be on time tomorrow, discuss with patient the office would not know that situation on the delays in the office .  patient suggest what would be the next office visit for Dr. Johney Frame . Offer appt in Dec 16. Patient still has concerns regarding appt . Stated why should  He pay for an office visit when the MD will be running late or have delays .  Relay the same message to patient again the office would not know that situation on the delays in the office for tomorrow appt . Patient ask if the nurse &  MD are in the office today. Inform patient both RN & MD  are not in the office today.     Patient ask to take a message  to speak personally  with Dr. Johney Frame. Patient did not want to disclose any information.

## 2012-10-20 NOTE — Telephone Encounter (Signed)
Will forward to dr allred 

## 2012-10-21 ENCOUNTER — Ambulatory Visit (INDEPENDENT_AMBULATORY_CARE_PROVIDER_SITE_OTHER): Payer: PRIVATE HEALTH INSURANCE | Admitting: Internal Medicine

## 2012-10-21 ENCOUNTER — Telehealth: Payer: Self-pay | Admitting: Oncology

## 2012-10-21 ENCOUNTER — Encounter: Payer: Self-pay | Admitting: Internal Medicine

## 2012-10-21 ENCOUNTER — Ambulatory Visit: Payer: PRIVATE HEALTH INSURANCE | Admitting: Pulmonary Disease

## 2012-10-21 ENCOUNTER — Other Ambulatory Visit: Payer: Self-pay | Admitting: Oncology

## 2012-10-21 VITALS — BP 140/78 | HR 80 | Ht 75.0 in | Wt 320.0 lb

## 2012-10-21 DIAGNOSIS — I10 Essential (primary) hypertension: Secondary | ICD-10-CM

## 2012-10-21 DIAGNOSIS — M899 Disorder of bone, unspecified: Secondary | ICD-10-CM

## 2012-10-21 DIAGNOSIS — I4891 Unspecified atrial fibrillation: Secondary | ICD-10-CM

## 2012-10-21 DIAGNOSIS — G4733 Obstructive sleep apnea (adult) (pediatric): Secondary | ICD-10-CM

## 2012-10-21 DIAGNOSIS — E7800 Pure hypercholesterolemia, unspecified: Secondary | ICD-10-CM

## 2012-10-21 NOTE — Patient Instructions (Signed)
Your physician recommends that you schedule a follow-up appointment in 3 months with Dr Allred    

## 2012-10-21 NOTE — Assessment & Plan Note (Signed)
Stable No change required today  

## 2012-10-21 NOTE — Telephone Encounter (Signed)
Fax referral to [facility] @ [facility], they don't use Epic hence referral was faxed waiting for appt for patient, pt notified.

## 2012-10-21 NOTE — Assessment & Plan Note (Signed)
Maintaining sinus rhythm No changes at this time Continue xarelto for stroke prevention

## 2012-10-21 NOTE — Assessment & Plan Note (Signed)
He will continue to follow closely with Dr Cyndie Chime

## 2012-10-21 NOTE — Progress Notes (Signed)
PCP: Judie Petit, MD  Derek Herrera is a 51 y.o. male who presents today for routine electrophysiology followup.  Since last being seen in our clinic, the patient reports doing reasonably well.  His recent biopsy for lytic bone lesion was nondiagnostic.  He remains in sinus rhythm and has had improvement in exercise tolerance/ fatigue.  His cough and SOB are resolved.  Today, he denies symptoms of palpitations, chest pain, lower extremity edema, dizziness, presyncope, or syncope.  The patient is otherwise without complaint today.   Past Medical History  Diagnosis Date  . DIABETES MELLITUS, TYPE I 06/29/2007  . HYPERTENSION 06/29/2007  . HYPERCHOLESTEROLEMIA 05/20/2010  . DM nephropathy/sclerosis   . Persistent atrial fibrillation   . Anemia   . Clotting disorder   . Obstructive sleep apnea     moderate by sleep study 11/13  . Lytic lesion of bone on x-ray 09/29/2012    Expansile lesion Left lateral rib on CT 09/23/12  Chest otherwise negative   Past Surgical History  Procedure Date  . Knee surgery     x's 3  . Tee with cardioversion 08/02/2011    EF of approximately 50-55% conversion to sinus rhythm.  There were no apparent complications  . Wrist fracture surgery right  . Cardioversion 09/03/2012    Procedure: CARDIOVERSION;  Surgeon: Pricilla Riffle, MD;  Location: [facility] ENDOSCOPY;  Service: Cardiovascular;  Laterality: N/A;  left message with leb RN to clarify if mcalhaney is doing this procedure/dl/10 2    Current Outpatient Prescriptions  Medication Sig Dispense Refill  . diltiazem (CARDIZEM CD) 360 MG 24 hr capsule Take 360 mg by mouth daily after lunch.      . flecainide (TAMBOCOR) 100 MG tablet Take 1 tablet (100 mg total) by mouth 2 (two) times daily.  60 tablet  3  . insulin aspart (NOVOLOG FLEXPEN) 100 UNIT/ML injection Take 45 units with breakfast, and 95 units with the evening meal  45 mL  1  . insulin glargine (LANTUS) 100 UNIT/ML injection Inject 80 Units into the skin  at bedtime.  30 mL  11  . losartan (COZAAR) 100 MG tablet Take 100 mg by mouth every evening.      . Rivaroxaban (XARELTO) 20 MG TABS Take 20 mg by mouth daily.  30 tablet  10  . simvastatin (ZOCOR) 80 MG tablet Take 0.5 tablets (40 mg total) by mouth at bedtime.  15 tablet  5  . traMADol (ULTRAM) 50 MG tablet Take 1 tablet (50 mg total) by mouth every 8 (eight) hours as needed for pain.  60 tablet  1   No current facility-administered medications for this visit.   Facility-Administered Medications Ordered in Other Visits  Medication Dose Route Frequency Provider Last Rate Last Dose  . [DISCONTINUED] 0.9 %  sodium chloride infusion   Intravenous Once Brayton El, PA      . [DISCONTINUED] fentaNYL (SUBLIMAZE) 0.05 MG/ML injection           . [DISCONTINUED] midazolam (VERSED) 2 MG/2ML injection             Physical Exam: Filed Vitals:   10/21/12 1604  BP: 140/78  Pulse: 80  Height: 6\' 3"  (1.905 m)  Weight: 320 lb (145.151 kg)    GEN- The patient is well appearing, alert and oriented x 3 today.   Head- normocephalic, atraumatic Eyes-  Sclera clear, conjunctiva pink Ears- hearing intact Oropharynx- clear Lungs- Clear to ausculation bilaterally, normal work of breathing Heart- Regular  rate and rhythm, no murmurs, rubs or gallops, PMI not laterally displaced GI- soft, NT, ND, + BS Extremities- no clubbing, cyanosis, or edema  ekg today reveals sinus rhythm 80 bpm, PR 188, QRS 122, Qtc 463, IVCD  Assessment and Plan:

## 2012-10-21 NOTE — Assessment & Plan Note (Signed)
Energy is much improved with CPAP.  He is very pleased with this. No changes today Compliance is encouraged

## 2012-10-26 ENCOUNTER — Encounter: Payer: Self-pay | Admitting: Oncology

## 2012-10-26 ENCOUNTER — Telehealth: Payer: Self-pay | Admitting: Oncology

## 2012-10-26 ENCOUNTER — Ambulatory Visit: Payer: PRIVATE HEALTH INSURANCE | Admitting: Oncology

## 2012-10-26 NOTE — Telephone Encounter (Signed)
lmonvm for pt re 12/10 appt @ [facility]. Pt given d/t/location. S/w Annette @ [facility] today to confirm appt. Also added comment to today's appt to send pt to scheduling for [facility] appt.

## 2012-10-26 NOTE — Progress Notes (Signed)
The patient was supposed to come in today to discuss results of a needle biopsy of a left rib when there is a suspected lytic lesion on CT scan. Unfortunately, only a scant amount of tissue was obtained which is not diagnostic. I have made arrangements for him to be evaluated by one of our cardiothoracic surgeons to assess for an open rib biopsy. I will reschedule his visit until after this evaluation.

## 2012-11-01 ENCOUNTER — Ambulatory Visit: Payer: PRIVATE HEALTH INSURANCE | Admitting: Oncology

## 2012-11-02 NOTE — Progress Notes (Signed)
appointment rescheduled due to non diagnostic rib biopsy.  Pt referred for open biopsy

## 2012-11-04 ENCOUNTER — Telehealth: Payer: Self-pay | Admitting: Pulmonary Disease

## 2012-11-04 NOTE — Telephone Encounter (Signed)
Download 11/13 cpap 12 cmis very effective, cuts down events to nml levels He needs to use CPAP at least 6h every night. Mask leak can be reduced by tighter seal

## 2012-11-04 NOTE — Telephone Encounter (Signed)
lmomtcb x1 for pt 

## 2012-11-09 ENCOUNTER — Institutional Professional Consult (permissible substitution) (INDEPENDENT_AMBULATORY_CARE_PROVIDER_SITE_OTHER): Payer: PRIVATE HEALTH INSURANCE | Admitting: Thoracic Surgery (Cardiothoracic Vascular Surgery)

## 2012-11-09 ENCOUNTER — Encounter: Payer: Self-pay | Admitting: Thoracic Surgery (Cardiothoracic Vascular Surgery)

## 2012-11-09 VITALS — BP 148/90 | HR 80 | Resp 20 | Ht 75.0 in | Wt 320.0 lb

## 2012-11-09 DIAGNOSIS — M899 Disorder of bone, unspecified: Secondary | ICD-10-CM

## 2012-11-09 DIAGNOSIS — M949 Disorder of cartilage, unspecified: Secondary | ICD-10-CM

## 2012-11-09 NOTE — Progress Notes (Signed)
PCP is Judie Petit, MD Referring Provider is Levert Feinstein, MD  Chief Complaint  Patient presents with  . Advice Only    Referral from Dr Marlena Clipper for surgical eval on Lytic lesion left rib, CT BX on 10/20/12, Chest CT 09/21/12    HPI: 51 year old gentleman referred by Dr. Cyndie Chime for consultation regarding a sclerotic lesion of the left seventh rib.   He had presented in October with bronchitis characterized with a severe cough. He also had recurrence of atrial fibrillation at that time. He developed left-sided rib pain. This was evaluated with a chest x-ray and then a CT scan. This showed a lytic lesion of the left seventh rib with a soft tissue component. Differential diagnosis was tumor versus unusual appearance of a healing fracture. He then saw Dr. Cyndie Chime who recommended a biopsy. That was done on November 20. Of note on the CT at that point the lesion was more sclerotic. Unfortunately that yielded only scant fragments of bone and cartilage and was nondiagnostic.  The patient is referred for surgical biopsy. He states that since he saw Dr. Cyndie Chime his pain has improved significantly. He does still have pain with direct contact and extreme motion.   Past Medical History  Diagnosis Date  . DIABETES MELLITUS, TYPE I 06/29/2007  . HYPERTENSION 06/29/2007  . HYPERCHOLESTEROLEMIA 05/20/2010  . DM nephropathy/sclerosis   . Persistent atrial fibrillation   . Anemia   . Clotting disorder   . Obstructive sleep apnea     moderate by sleep study 11/13  . Lytic lesion of bone on x-ray 09/29/2012    Expansile lesion Left lateral rib on CT 09/23/12  Chest otherwise negative    Past Surgical History  Procedure Date  . Knee surgery     x's 3  . Tee with cardioversion 08/02/2011    EF of approximately 50-55% conversion to sinus rhythm.  There were no apparent complications  . Wrist fracture surgery right  . Cardioversion 09/03/2012    Procedure: CARDIOVERSION;   Surgeon: Pricilla Riffle, MD;  Location: [facility] ENDOSCOPY;  Service: Cardiovascular;  Laterality: N/A;  left message with leb RN to clarify if mcalhaney is doing this procedure/dl/10 2    Family History  Problem Relation Age of Onset  . Diabetes Maternal Grandmother   . Cancer Paternal Grandfather   . Bone cancer Maternal Grandmother     Social History History  Substance Use Topics  . Smoking status: Former Smoker -- 1.0 packs/day for 20 years    Types: Cigarettes    Quit date: 12/01/1990  . Smokeless tobacco: Not on file     Comment: smokked off an on x 20 yrs 09/20/12  . Alcohol Use: No    Current Outpatient Prescriptions  Medication Sig Dispense Refill  . diltiazem (CARDIZEM CD) 360 MG 24 hr capsule Take 360 mg by mouth daily after lunch.      . flecainide (TAMBOCOR) 100 MG tablet Take 1 tablet (100 mg total) by mouth 2 (two) times daily.  60 tablet  3  . insulin aspart (NOVOLOG FLEXPEN) 100 UNIT/ML injection Take 45 units with breakfast, and 95 units with the evening meal  45 mL  1  . insulin glargine (LANTUS) 100 UNIT/ML injection Inject 80 Units into the skin at bedtime.  30 mL  11  . losartan (COZAAR) 100 MG tablet Take 100 mg by mouth every evening.      . Rivaroxaban (XARELTO) 20 MG TABS Take 20 mg by mouth daily.  30 tablet  10  . simvastatin (ZOCOR) 80 MG tablet Take 0.5 tablets (40 mg total) by mouth at bedtime.  15 tablet  5  . traMADol (ULTRAM) 50 MG tablet Take 1 tablet (50 mg total) by mouth every 8 (eight) hours as needed for pain.  60 tablet  1    No Known Allergies  Review of Systems  Respiratory: Positive for apnea (CPAP QHS) and cough.   Cardiovascular: Positive for chest pain (chest wall).       Atrial fibrillation, Poor circulation in legs  Musculoskeletal: Positive for arthralgias.  All other systems reviewed and are negative.    BP 148/90  Pulse 80  Resp 20  Ht 6\' 3"  (1.905 m)  Wt 320 lb (145.151 kg)  BMI 40.00 kg/m2  SpO2 96% Physical Exam   Vitals reviewed. Constitutional: He is oriented to person, place, and time. No distress.       Morbidly obese  HENT:  Head: Normocephalic and atraumatic.  Eyes: EOM are normal. Pupils are equal, round, and reactive to light.  Neck: Neck supple. No thyromegaly present.  Cardiovascular: Normal rate, regular rhythm and normal heart sounds.  Exam reveals no friction rub.   No murmur heard. Pulmonary/Chest: Effort normal and breath sounds normal. He has no wheezes. He has no rales.       Mild tenderness to palpation left lateral chest wall, no mass palpable  Abdominal: Soft. There is no tenderness.  Musculoskeletal: He exhibits edema.  Lymphadenopathy:    He has no cervical adenopathy.  Neurological: He is alert and oriented to person, place, and time. No cranial nerve deficit.  Skin: Skin is warm and dry.     Diagnostic Tests: 09/21/12 CT CHEST WITHOUT CONTRAST  Technique: Multidetector CT imaging of the chest was performed  following the standard protocol without IV contrast.  Comparison: Chest radiograph 09/02/2012  Findings: There is an expansile lytic lesion with associated soft  tissue component in the left lateral seventh rib. No other  visualized osseous abnormality is identified.  Heart size is normal. No lymphadenopathy. Mild moderate coronary  arterial calcification noted. No pericardial or pleural effusion.  Bibasilar curvilinear areas of scarring are noted. The lungs are  otherwise clear. Central airways are patent.  IMPRESSION:  Expansile lytic mass lesion involving the left lateral seventh rib  with associated soft tissue component. Differential considerations  include metastasis, osteosarcoma, enchondroma, or much less likely  unusual healing response with fracture. If the patient has no  known history of primary malignancy, consider CT abdomen pelvis for  further evaluation.  These results will be called to the ordering clinician or  representative by the  Radiologist Assistant, and communication  documented in the PACS Dashboard.  10/20/12 *RADIOLOGY REPORT*  Indication: Possible lytic lesion involving the lateral aspect of  the left seventh rib; otherwise normal skeletal survey, PSA and  immunoglobulin studies.  CT GUIDED LEFT SEVENETH RIB LESION CORE NEEDLE BIOPSY  Comparisons: Chest CT - 09/21/2012; skeletal survey - 10/04/2012  Intravenous medications: Fentanyl 100 mcg IV; Versed 2 mg IV  Contrast: None  Sedation time: 20 minutes  Complications: None immediate  TECHNIQUE/FINDINGS:  Informed consent was obtained from the patient following an  explanation of the procedure, risks, benefits and alternatives.  The patient understands, agrees and consents for the procedure.  All questions were addressed. A time out was performed prior to  the initiation of the procedure.  The patient was positioned supine on the CT table and a limited  chest CT was performed for procedural planning demonstrating  increased sclerosis about the previously identified nondisplaced  lateral left seventh rib fracture. The now largely sclerotic  lesion measures approximately 2.8 x 2.2 cm (image 20, series 2).  The operative site was prepped and draped in the usual sterile  fashion. Under sterile conditions and local anesthesia, a 17 gauge  coaxial needle was advanced into the peripheral aspect of the  nodule. Positioning was confirmed with intermittent CT fluoroscopy  and 4 core needle biopsies were obtained with an 18 gauge core  needle biopsy device.  Limited post procedural chest CT was negative for pneumothorax or  additional complication. The co-axial needle was removed and  hemostasis was achieved with manual compression. A dressing was  placed. The patient tolerated the procedure well without immediate  postprocedural complication.  IMPRESSION:  Technically successful CT guided core needle biopsy of increasingly  sclerotic lesion adjacent to the  previously identified nondisplaced  left sided seventh rib fracture. Of note, this increased sclerosis  may represent interval callus formation.   Impression: 51 year old gentleman with a abnormality of his left seventh rib. He has been having pain in that area for about 6 weeks which correlates to an episode of bronchitis where he was having severe coughing spells. The original CT in October showed what appeared to be a lytic lesion with surrounding soft tissue involvement. This was an unusual appearance for a fracture particularly for one associated with coughing. Subsequent CT done in November at the time of the needle biopsy showed marked sclerosis in the area. Again this had a really unusual appearance for a healing fracture although that cannot be completely ruled out. The biopsy was nondiagnostic. His time course would be consistent with a healing fracture, but there is a possibility it could be a pathologic fracture.  In the interim since the biopsy his pain has markedly improved. I had a long discussion with Mr. Crescenzo regarding the differential diagnosis. He understands that this could represent a benign healing fracture, a sarcoma or an isolated plasmacytoma or a metastatic lesion. He thinks that because his blood work was unremarkable but this is more likely be benign. I told him that that really was not the case.  My recommendation was that we proceed with a surgical biopsy and possible chest wall resection. The plan would be to make an incision in take an incisional biopsy, if we were able to make a diagnosis of a resectable malignancy on intraoperative frozen section we would then proceed with definitive resection at that time. I did discuss with him that given the sclerosis and the potential need to decalcify the lesion that we may have to do this in a staged fashion.   He is very reluctant to consider a surgical biopsy due to concerns about pain and being out of work. He had questions  about continued radiographic followup. I don't think that is a good idea given the significant change in the lesion between October and November. If this is in fact malignant, then that is pretty rapid change. If he does decide not to have surgical biopsy and wants radiographic followup we will plan to do that in about 4 weeks.  Plan: Mr. Crisafulli wishes to think over these issues and discuss with family/ friends prior to making a decision. He will contact the office once he has made a decision.

## 2012-11-10 NOTE — Telephone Encounter (Signed)
lmomtcb x 2  

## 2012-11-10 NOTE — Telephone Encounter (Signed)
I spoke with patient about results and he verbalized understanding and had no questions. i spoke with pt he stated he is feeling more rested since using this. Nothing further was needed

## 2012-11-10 NOTE — Telephone Encounter (Signed)
Pt returned call. Derek Herrera °

## 2012-11-12 ENCOUNTER — Other Ambulatory Visit: Payer: Self-pay | Admitting: Oncology

## 2012-11-12 ENCOUNTER — Telehealth: Payer: Self-pay | Admitting: *Deleted

## 2012-11-12 DIAGNOSIS — M899 Disorder of bone, unspecified: Secondary | ICD-10-CM

## 2012-11-12 NOTE — Telephone Encounter (Signed)
Received vm call from pt stating that Dr. Cyndie Chime referred him to Dr. Dorris Fetch for open biopsy of his rib  & he has been seen .  He reports that Dr Dorris Fetch will be off Christmas.  The pt would like to talk with Dr Cyndie Chime to get a personal opinion & request call back to [phone].

## 2012-11-15 ENCOUNTER — Telehealth: Payer: Self-pay | Admitting: Oncology

## 2012-11-15 NOTE — Telephone Encounter (Signed)
Insurance don't need precert per Lilyan Punt for Pet scan

## 2012-11-16 ENCOUNTER — Emergency Department (HOSPITAL_COMMUNITY)
Admission: EM | Admit: 2012-11-16 | Discharge: 2012-11-16 | Disposition: A | Discharge status: Home / Self Care | Payer: PRIVATE HEALTH INSURANCE | Attending: Emergency Medicine | Admitting: Emergency Medicine

## 2012-11-16 ENCOUNTER — Ambulatory Visit: Payer: PRIVATE HEALTH INSURANCE | Admitting: Oncology

## 2012-11-16 ENCOUNTER — Telehealth: Payer: Self-pay | Admitting: *Deleted

## 2012-11-16 ENCOUNTER — Encounter (HOSPITAL_COMMUNITY): Payer: Self-pay | Admitting: *Deleted

## 2012-11-16 ENCOUNTER — Emergency Department (HOSPITAL_COMMUNITY): Payer: PRIVATE HEALTH INSURANCE

## 2012-11-16 DIAGNOSIS — I10 Essential (primary) hypertension: Secondary | ICD-10-CM | POA: Insufficient documentation

## 2012-11-16 DIAGNOSIS — Z794 Long term (current) use of insulin: Secondary | ICD-10-CM | POA: Insufficient documentation

## 2012-11-16 DIAGNOSIS — G4733 Obstructive sleep apnea (adult) (pediatric): Secondary | ICD-10-CM | POA: Insufficient documentation

## 2012-11-16 DIAGNOSIS — Y929 Unspecified place or not applicable: Secondary | ICD-10-CM | POA: Insufficient documentation

## 2012-11-16 DIAGNOSIS — Y939 Activity, unspecified: Secondary | ICD-10-CM | POA: Insufficient documentation

## 2012-11-16 DIAGNOSIS — S8391XA Sprain of unspecified site of right knee, initial encounter: Secondary | ICD-10-CM

## 2012-11-16 DIAGNOSIS — Z79899 Other long term (current) drug therapy: Secondary | ICD-10-CM | POA: Insufficient documentation

## 2012-11-16 DIAGNOSIS — Z8679 Personal history of other diseases of the circulatory system: Secondary | ICD-10-CM | POA: Insufficient documentation

## 2012-11-16 DIAGNOSIS — Z862 Personal history of diseases of the blood and blood-forming organs and certain disorders involving the immune mechanism: Secondary | ICD-10-CM | POA: Insufficient documentation

## 2012-11-16 DIAGNOSIS — IMO0002 Reserved for concepts with insufficient information to code with codable children: Secondary | ICD-10-CM | POA: Insufficient documentation

## 2012-11-16 DIAGNOSIS — Z872 Personal history of diseases of the skin and subcutaneous tissue: Secondary | ICD-10-CM | POA: Insufficient documentation

## 2012-11-16 DIAGNOSIS — N058 Unspecified nephritic syndrome with other morphologic changes: Secondary | ICD-10-CM | POA: Insufficient documentation

## 2012-11-16 DIAGNOSIS — E1029 Type 1 diabetes mellitus with other diabetic kidney complication: Secondary | ICD-10-CM | POA: Insufficient documentation

## 2012-11-16 DIAGNOSIS — Z87891 Personal history of nicotine dependence: Secondary | ICD-10-CM | POA: Insufficient documentation

## 2012-11-16 DIAGNOSIS — W010XXA Fall on same level from slipping, tripping and stumbling without subsequent striking against object, initial encounter: Secondary | ICD-10-CM | POA: Insufficient documentation

## 2012-11-16 DIAGNOSIS — Z9889 Other specified postprocedural states: Secondary | ICD-10-CM | POA: Insufficient documentation

## 2012-11-16 DIAGNOSIS — E7800 Pure hypercholesterolemia, unspecified: Secondary | ICD-10-CM | POA: Insufficient documentation

## 2012-11-16 MED ORDER — OXYCODONE-ACETAMINOPHEN 5-325 MG PO TABS: 1.0000 | ORAL_TABLET | Freq: Four times a day (QID) | ORAL | Status: DC | PRN

## 2012-11-16 MED ORDER — OXYCODONE-ACETAMINOPHEN 5-325 MG PO TABS
2.0000 | ORAL_TABLET | Freq: Once | ORAL | Status: AC
  Administered 2012-11-16: 2 via ORAL
  Filled 2012-11-16: qty 2

## 2012-11-16 NOTE — ED Notes (Signed)
Patient reports he fell on yesterday.  He noted soreness in his knee after the fall,  The pain has increased to the point he is having difficulty walking.  Patient has swelling to the knee as well.  He denies any other injuries

## 2012-11-16 NOTE — Progress Notes (Signed)
Orthopedic Tech Progress Note Patient Details:  Derek Herrera September 23, 1961 [id number]  Ortho Devices Type of Ortho Device: Crutches;Knee Immobilizer Ortho Device/Splint Location: right leg Ortho Device/Splint Interventions: Application   Nikki Dom 11/16/2012, 5:44 PM

## 2012-11-16 NOTE — ED Provider Notes (Signed)
History    This chart was scribed for Derek Lyons, MD, MD by Smitty Pluck, ED Scribe. The patient was seen in room [facility] and the patient's care was started at 5:20PM.   CSN: [id number]  Arrival date & time 11/16/12  1541      Chief Complaint  Patient presents with  . Knee Pain    (Consider location/radiation/quality/duration/timing/severity/associated sxs/prior treatment) The history is provided by the patient.   Derek Herrera is a 51 y.o. male with h/o DM, HTN and knee surgerywho presents to the Emergency Department complaining of constant, moderate right knee pain onset 1 day ago. Pt reports that he fell 1 day ago due to slipping on wet surface. Pt reports pain is aggravated by bearing weight. He reports hx of knee surgery on both knees. He denies back pain, dizziness, head injury, LOC, numbness, weakness and any other pain currently.   Past Medical History  Diagnosis Date  . DIABETES MELLITUS, TYPE I 06/29/2007  . HYPERTENSION 06/29/2007  . HYPERCHOLESTEROLEMIA 05/20/2010  . DM nephropathy/sclerosis   . Persistent atrial fibrillation   . Anemia   . Clotting disorder   . Obstructive sleep apnea     moderate by sleep study 11/13  . Lytic lesion of bone on x-ray 09/29/2012    Expansile lesion Left lateral rib on CT 09/23/12  Chest otherwise negative    Past Surgical History  Procedure Date  . Knee surgery     x's 3  . Tee with cardioversion 08/02/2011    EF of approximately 50-55% conversion to sinus rhythm.  There were no apparent complications  . Wrist fracture surgery right  . Cardioversion 09/03/2012    Procedure: CARDIOVERSION;  Surgeon: Pricilla Riffle, MD;  Location: [facility] ENDOSCOPY;  Service: Cardiovascular;  Laterality: N/A;  left message with leb RN to clarify if mcalhaney is doing this procedure/dl/10 2    Family History  Problem Relation Age of Onset  . Diabetes Maternal Grandmother   . Cancer Paternal Grandfather   . Bone cancer Maternal Grandmother      History  Substance Use Topics  . Smoking status: Former Smoker -- 1.0 packs/day for 20 years    Types: Cigarettes    Quit date: 12/01/1990  . Smokeless tobacco: Not on file     Comment: smokked off an on x 20 yrs 09/20/12  . Alcohol Use: No      Review of Systems  Constitutional: Negative for fever and chills.  Respiratory: Negative for shortness of breath.   Gastrointestinal: Negative for nausea and vomiting.  Musculoskeletal: Positive for arthralgias.  Neurological: Negative for dizziness, weakness and numbness.  All other systems reviewed and are negative.    Allergies  Review of patient's allergies indicates no known allergies.  Home Medications   Current Outpatient Rx  Name  Route  Sig  Dispense  Refill  . DILTIAZEM HCL ER COATED BEADS 360 MG PO CP24   Oral   Take 360 mg by mouth daily after lunch.         Marland Kitchen FLECAINIDE ACETATE 100 MG PO TABS   Oral   Take 1 tablet (100 mg total) by mouth 2 (two) times daily.   60 tablet   3   . INSULIN ASPART 100 UNIT/ML [place] SOLN   Subcutaneous   Inject 45-95 Units into the skin 2 (two) times daily before a meal. Take 45 units with breakfast, and 95 units with the evening meal         .  INSULIN GLARGINE 100 UNIT/ML [place] SOLN   Subcutaneous   Inject 75-80 Units into the skin at bedtime.         Marland Kitchen LOSARTAN POTASSIUM 100 MG PO TABS   Oral   Take 100 mg by mouth every evening.         Marland Kitchen RIVAROXABAN 20 MG PO TABS   Oral   Take 20 mg by mouth daily.   30 tablet   10   . SIMVASTATIN 80 MG PO TABS   Oral   Take 0.5 tablets (40 mg total) by mouth at bedtime.   15 tablet   5   . TRAMADOL HCL 50 MG PO TABS   Oral   Take 50 mg by mouth every 8 (eight) hours as needed. For pain           BP 146/90  Pulse 97  Temp 98.9 F (37.2 C) (Oral)  Resp 20  Ht 6\' 3"  (1.905 m)  Wt 310 lb (140.615 kg)  BMI 38.75 kg/m2  SpO2 98%  Physical Exam  Nursing note and vitals reviewed. Constitutional: He is oriented to  person, place, and time. He appears well-developed and well-nourished. No distress.  HENT:  Head: Normocephalic and atraumatic.  Eyes: EOM are normal.  Neck: Neck supple. No tracheal deviation present.  Cardiovascular: Normal rate.   Pulmonary/Chest: Effort normal. No respiratory distress.  Musculoskeletal:       Moderate sized effusion of right knee Pain with ROM of right knee which limits exam  No tenderness of left knee      Neurological: He is alert and oriented to person, place, and time.  Skin: Skin is warm and dry.  Psychiatric: He has a normal mood and affect. His behavior is normal.    ED Course  Procedures (including critical care time) DIAGNOSTIC STUDIES: Oxygen Saturation is 98% on room air, normal by my interpretation.    COORDINATION OF CARE:  5:23 PM Discussed ED treatment with pt     Labs Reviewed - No data to display Dg Knee Complete 4 Views Right  11/16/2012  *RADIOLOGY REPORT*  Clinical Data: Knee pain  RIGHT KNEE - COMPLETE 4+ VIEW  Comparison: None.  Findings: Four views of the right knee submitted.  No acute fracture or subluxation.  Moderate joint effusion.  Narrowing of patellofemoral joint space.  There is narrowing of medial joint compartment.  Spurring of medial and lateral femoral condyle.  Mild spurring of medial tibial plateau.  IMPRESSION: No acute fracture or subluxation.  Degenerative changes as described above.  Moderate joint effusion.   Original Report Authenticated By: Natasha Mead, M.D.      No diagnosis found.    MDM  Looks like djd with effusion.  Will treat with immobilizer, prednisone, and pain meds.  Follow up with orthopedics for recheck in 1-2 weeks.        I personally performed the services described in this documentation, which was scribed in my presence. The recorded information has been reviewed and is accurate.      Derek Lyons, MD 11/17/12 [phone]

## 2012-11-16 NOTE — ED Notes (Signed)
Patient transported to X-ray 

## 2012-11-16 NOTE — Telephone Encounter (Signed)
Spoke with patient, he took a bad fall outside yesterday.  Fell on some icy sheeting and injured his right knee.  He thought it was ok yesterday, however today it is very painful, swollen and he can not put any weight on it.  He is scheduled for PET scan on Thursday.  Should he keep that appt.?    Let patient know that given the severity of his sx with his right knee that he should see about it today - he does not have an orthopedic MD, so I suggested he go to an urgent care or ED today to have this injury assessed.   He should keep his appt for the PET scan unless their are issues with his knee that prevent him from having the scan.

## 2012-11-18 ENCOUNTER — Telehealth: Payer: Self-pay | Admitting: *Deleted

## 2012-11-18 ENCOUNTER — Encounter (HOSPITAL_COMMUNITY): Admission: RE | Admit: 2012-11-18 | Payer: PRIVATE HEALTH INSURANCE | Source: Ambulatory Visit

## 2012-11-18 ENCOUNTER — Ambulatory Visit: Payer: PRIVATE HEALTH INSURANCE | Admitting: Pulmonary Disease

## 2012-11-18 NOTE — Telephone Encounter (Signed)
Received call from pt stating that he had to cancel his PET due to a sprained knee on Monday.  He reports that he went to the ED for eval & has an immobilization cast & will see ortho tomorrow. He hurt so bad that he couldn't hardly move.  He will r/s PET when he is back on his feet.  FYI to Dr. Cyndie Chime.

## 2012-11-30 ENCOUNTER — Ambulatory Visit (HOSPITAL_COMMUNITY)
Admission: RE | Admit: 2012-11-30 | Discharge: 2012-11-30 | Disposition: A | Discharge status: Home / Self Care | Payer: PRIVATE HEALTH INSURANCE | Source: Ambulatory Visit | Attending: Oncology | Admitting: Oncology

## 2012-11-30 DIAGNOSIS — M899 Disorder of bone, unspecified: Secondary | ICD-10-CM | POA: Insufficient documentation

## 2012-11-30 DIAGNOSIS — I517 Cardiomegaly: Secondary | ICD-10-CM | POA: Insufficient documentation

## 2012-11-30 DIAGNOSIS — I2510 Atherosclerotic heart disease of native coronary artery without angina pectoris: Secondary | ICD-10-CM | POA: Insufficient documentation

## 2012-11-30 DIAGNOSIS — M949 Disorder of cartilage, unspecified: Secondary | ICD-10-CM | POA: Insufficient documentation

## 2012-11-30 LAB — GLUCOSE, CAPILLARY: Glucose-Capillary: 170 mg/dL — ABNORMAL HIGH (ref 70–99)

## 2012-11-30 MED ORDER — FLUDEOXYGLUCOSE F - 18 (FDG) INJECTION
18.5000 | Freq: Once | INTRAVENOUS | Status: AC | PRN
  Administered 2012-11-30: 18.5 via INTRAVENOUS

## 2012-12-01 ENCOUNTER — Encounter: Payer: Self-pay | Admitting: Oncology

## 2012-12-01 NOTE — Progress Notes (Signed)
I called and spoke with the patient's wife to report results of PET scan done yesterday. Patient was under evaluation for was initially reticent expansile lytic lesion of a left eighth rib. Of note this occurred in the context of somebody who had a hacking cough for over 6 weeks. Preliminary evaluation showed no other lesions on bone x-rays, normal serum protein electrophoresis and immunofixation electrophoresis, normal PSA level and a nondiagnostic needle biopsy of the rib done on November 20. And a normal CT scan of the chest on October 22. Findings are consistent with low metabolic uptake over a callus consistent with a healing, benign, rib fracture. There is also evidence for a healing fracture and a lateral left 10th rib. There are no other areas of hypermetabolic activity. Impression: Healing benign rib fractures no evidence for malignancy Plan: No further evaluation is planned at this time.

## 2013-01-03 ENCOUNTER — Ambulatory Visit (INDEPENDENT_AMBULATORY_CARE_PROVIDER_SITE_OTHER): Payer: Self-pay | Admitting: Endocrinology

## 2013-01-03 VITALS — BP 128/74 | HR 65 | Wt 312.0 lb

## 2013-01-03 DIAGNOSIS — E109 Type 1 diabetes mellitus without complications: Secondary | ICD-10-CM

## 2013-01-03 LAB — HEMOGLOBIN A1C: Hgb A1c MFr Bld: 8 % — ABNORMAL HIGH (ref 4.6–6.5)

## 2013-01-03 NOTE — Patient Instructions (Addendum)
blood tests are being requested for you today.  We'll contact you with results. check your blood sugar once a day.  vary the time of day when you check, between before the 3 meals, and at bedtime.  also check if you have symptoms of your blood sugar being too high or too low.  please keep a record of the readings and bring it to your next appointment here.  please call us sooner if your blood sugar goes below 70, or if you have a lot of readings over 200.  Please come back for a follow-up appointment in 3 months Please resume the insulin ASAP.  Here are some samples

## 2013-01-03 NOTE — Progress Notes (Signed)
Subjective:    Patient ID: Derek Herrera, male    DOB: Apr 07, 1961, 52 y.o.   MRN: [id number]  HPI pt returns for f/u of IDDM (dx'ed 1995, complicated by nephropathy; therapy has been limited by lack of health insurance).  He ran out of insulin a few days ago.  He hopes to resume, based on pt assistance.  When he had insulin, cbg's were well-controlled.  pt states he feels well in general. Past Medical History  Diagnosis Date  . DIABETES MELLITUS, TYPE I 06/29/2007  . HYPERTENSION 06/29/2007  . HYPERCHOLESTEROLEMIA 05/20/2010  . DM nephropathy/sclerosis   . Persistent atrial fibrillation   . Anemia   . Clotting disorder   . Obstructive sleep apnea     moderate by sleep study 11/13  . Lytic lesion of bone on x-ray 09/29/2012    Expansile lesion Left lateral rib on CT 09/23/12  Chest otherwise negative    Past Surgical History  Procedure Date  . Knee surgery     x's 3  . Tee with cardioversion 08/02/2011    EF of approximately 50-55% conversion to sinus rhythm.  There were no apparent complications  . Wrist fracture surgery right  . Cardioversion 09/03/2012    Procedure: CARDIOVERSION;  Surgeon: Pricilla Riffle, MD;  Location: [facility] ENDOSCOPY;  Service: Cardiovascular;  Laterality: N/A;  left message with leb RN to clarify if mcalhaney is doing this procedure/dl/10 2    History   Social History  . Marital Status: Married    Spouse Name: N/A    Number of Children: 5  . Years of Education: N/A   Occupational History  . 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 Smoking status: Former Smoker -- 1.0 packs/day for 20 years    Types: Cigarettes    Quit date: 12/01/1990  . Smokeless tobacco: Not on file     Comment: smokked off an on x 20 yrs 09/20/12  . Alcohol Use: No  . Drug Use: No  . Sexually Active: Not Currently   Other Topics Concern  . Not on file   Social History Narrative  . No narrative on file    Current Outpatient Prescriptions on File  Prior to Visit  Medication Sig Dispense Refill  . diltiazem (CARDIZEM CD) 360 MG 24 hr capsule Take 360 mg by mouth daily after lunch.      . flecainide (TAMBOCOR) 100 MG tablet Take 1 tablet (100 mg total) by mouth 2 (two) times daily.  60 tablet  3  . insulin aspart (NOVOLOG) 100 UNIT/ML injection Inject 45-95 Units into the skin 2 (two) times daily before a meal. Take 45 units with breakfast, and 95 units with the evening meal      . insulin glargine (LANTUS) 100 UNIT/ML injection Inject 75-80 Units into the skin at bedtime.      Marland Kitchen losartan (COZAAR) 100 MG tablet Take 100 mg by mouth every evening.      Marland Kitchen oxyCODONE-acetaminophen (PERCOCET/ROXICET) 5-325 MG per tablet Take 1-2 tablets by mouth every 6 (six) hours as needed for pain.  20 tablet  0  . Rivaroxaban (XARELTO) 20 MG TABS Take 20 mg by mouth daily.  30 tablet  10  . simvastatin (ZOCOR) 80 MG tablet Take 0.5 tablets (40 mg total) by mouth at bedtime.  15 tablet  5  . traMADol (ULTRAM) 50 MG tablet Take 50 mg by mouth every 8 (eight) hours as needed. For  pain        No Known Allergies  Family History  Problem Relation Age of Onset  . Diabetes Maternal Grandmother   . Cancer Paternal Grandfather   . Bone cancer Maternal Grandmother    BP 128/74  Pulse 65  Wt 312 lb (141.522 kg)  SpO2 98%  Review of Systems denies hypoglycemia    Objective:   Physical Exam Pulses: dorsalis pedis intact bilat.   Feet: no deformity.  no ulcer on the feet.  feet are of normal color and temp.  1+ bilat leg edema.  There is bilateral onychomycosis Neuro: sensation is intact to touch on the feet.      Assessment & Plan:  DM: therapy limited by lack of insurance, and by noncompliance.  i'll do the best i can.

## 2013-01-11 ENCOUNTER — Telehealth: Payer: Self-pay | Admitting: Endocrinology

## 2013-01-11 NOTE — Telephone Encounter (Signed)
Please refill prn 

## 2013-01-11 NOTE — Telephone Encounter (Signed)
The patient was seen on 01/03/13 and given three insulin flex pens.  The patient states he is out of insulin and has not gotten the necessary paper work to get insulin assistance.  The patient is requesting at least two flex pens to get him through the snow and until he can get insulin through insulin assistance.  The patient may be reached at [phone].

## 2013-01-15 ENCOUNTER — Other Ambulatory Visit: Payer: Self-pay

## 2013-01-17 NOTE — Telephone Encounter (Signed)
Returned call to patient on cell # [phone]. Left message that patient assistance forms are completed and will be faxed to # on form. Left message for patient to let us know about samples of flex pens that are needed.Marland Kitchen

## 2013-01-18 ENCOUNTER — Other Ambulatory Visit: Payer: Self-pay | Admitting: Neurology

## 2013-01-18 DIAGNOSIS — E7800 Pure hypercholesterolemia, unspecified: Secondary | ICD-10-CM

## 2013-01-18 MED ORDER — SIMVASTATIN 80 MG PO TABS: 40.0000 mg | ORAL_TABLET | Freq: Every day | ORAL | Status: DC

## 2013-01-26 ENCOUNTER — Telehealth: Payer: Self-pay | Admitting: Endocrinology

## 2013-01-26 NOTE — Telephone Encounter (Signed)
cheapest option is to buy regular insulin ("R"), from [facility].  No prescription is needed.

## 2013-01-26 NOTE — Telephone Encounter (Signed)
The patient called to request more samples of his insulin pen because he has not gotten his shipment from [facility] and he is out of his insulin.  The patient may be reached at [phone].

## 2013-01-26 NOTE — Telephone Encounter (Signed)
If no samples pt would like to know if you would change rx to a different med, pt has not heard back from pt assistance

## 2013-01-26 NOTE — Telephone Encounter (Signed)
Left message on v-mail for pt, pt to call back and confirm message

## 2013-01-27 ENCOUNTER — Telehealth: Payer: Self-pay

## 2013-01-27 NOTE — Telephone Encounter (Signed)
Pt has questions about insulin, please advise [phone], Dr. Everardo All wants pt to get "R" otc at [facility]

## 2013-01-28 NOTE — Telephone Encounter (Signed)
Called pt and left message to call me back to discuss, but did not call back yet.

## 2013-02-09 ENCOUNTER — Other Ambulatory Visit: Payer: Self-pay

## 2013-02-09 MED ORDER — FLECAINIDE ACETATE 100 MG PO TABS: 100.0000 mg | ORAL_TABLET | Freq: Two times a day (BID) | ORAL | Status: DC

## 2013-03-10 ENCOUNTER — Telehealth: Payer: Self-pay | Admitting: Pulmonary Disease

## 2013-03-10 NOTE — Telephone Encounter (Signed)
Download 12/2012 on 12 cm- no residuals , compliance > 4h, no leak

## 2013-03-15 NOTE — Telephone Encounter (Signed)
Patient active with mychart Message sent to pt w/ results Pt requested to reply to ensure he received results Will sign off

## 2013-03-17 NOTE — Telephone Encounter (Signed)
Received error message /b/c pt has yet to ready his 4.15.14 mychart message Called spoke with patient, advised of download results as stated by RA Pt verbalized his understanding and will disregard the mychart message Nothing further needed

## 2013-07-01 IMAGING — CT CT BIOPSY
1 of 2 series · 18 of 22 positions shown, 25 images · non-contrast
Comparison: none

INDICATION: Possible lytic lesion involving the lateral aspect of
the left seventh rib; otherwise normal skeletal survey, PSA and
immunoglobulin studies.

[Series 5: (hospital) 6.0 b30f · axial · 1.29mm/px · z∈[-181,-181]mm · 18 of 20 slices shown, 25 images]
[im 2/20  soft-tissue]
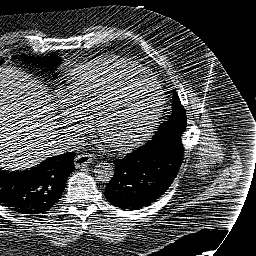
[im 2/20  lung]
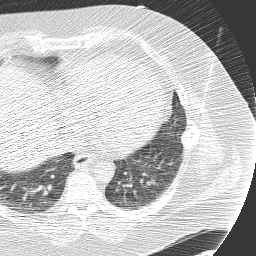
[im 2/20  bone]
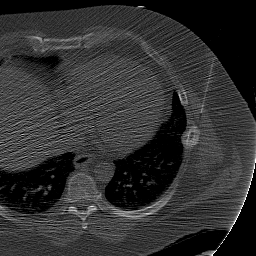
[im 3/20  soft-tissue]
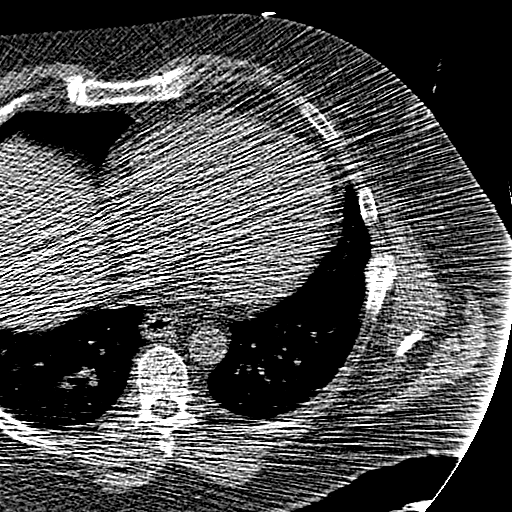
[im 3/20  lung]
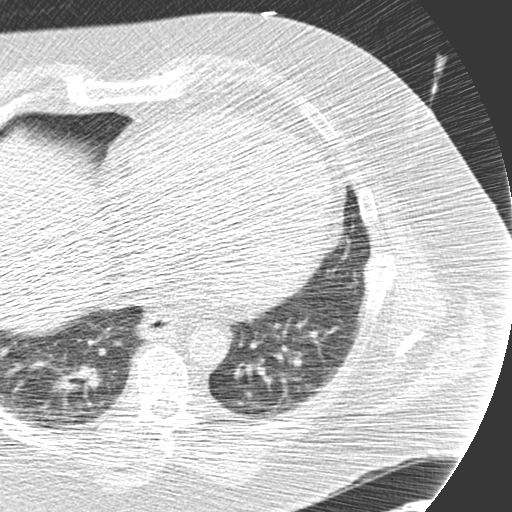
[im 4/20  soft-tissue]
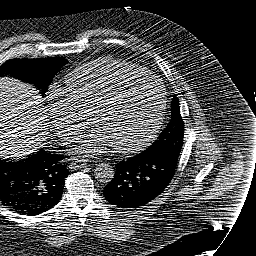
[im 4/20  lung]
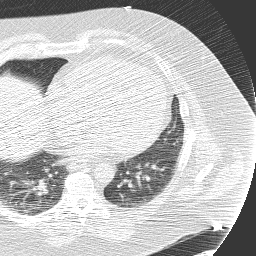
[im 5/20  soft-tissue]
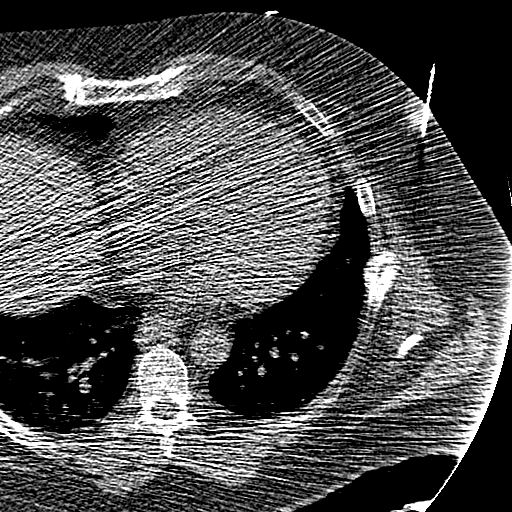
[im 5/20  lung]
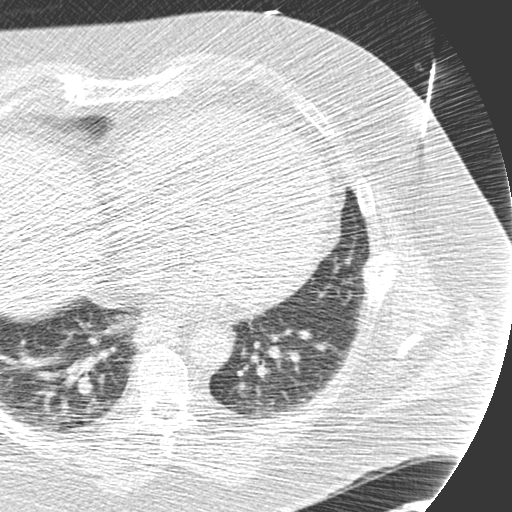
[im 6/20  soft-tissue]
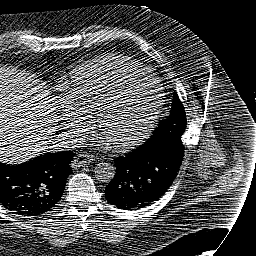
[im 7/20  soft-tissue]
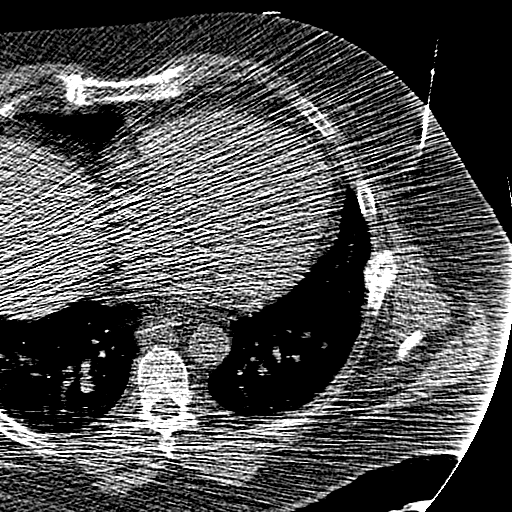
[im 8/20  soft-tissue]
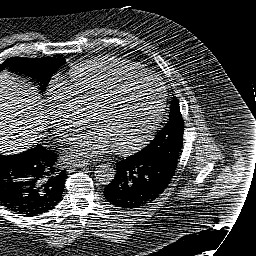
[im 9/20  soft-tissue]
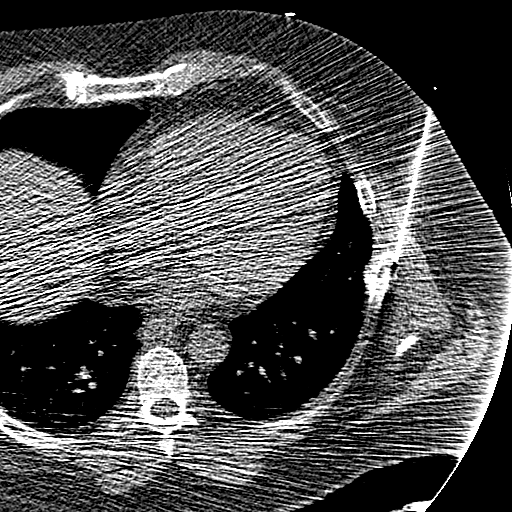
[im 10/20  soft-tissue]
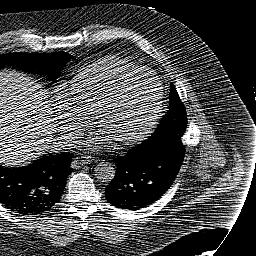
[im 10/20  bone]
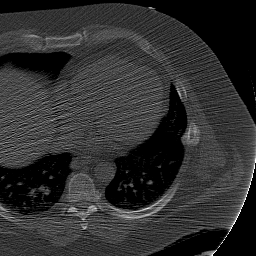
[im 11/20  soft-tissue]
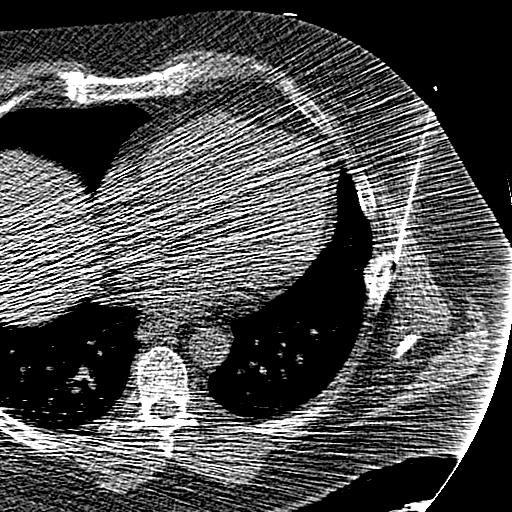
[im 12/20  soft-tissue]
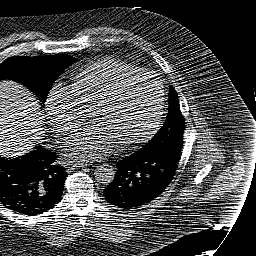
[im 13/20  soft-tissue]
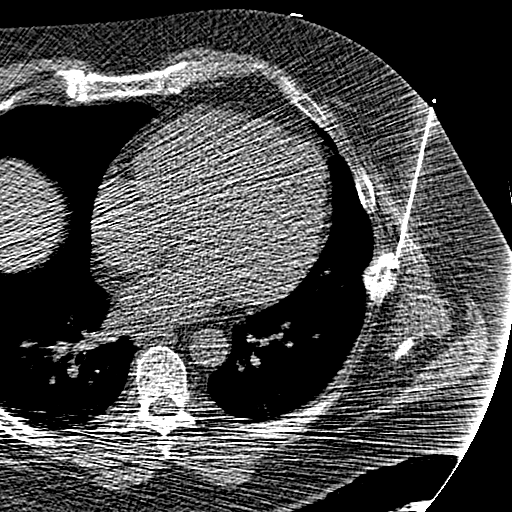
[im 14/20  soft-tissue]
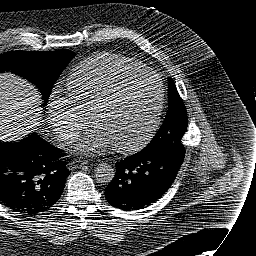
[im 15/20  soft-tissue]
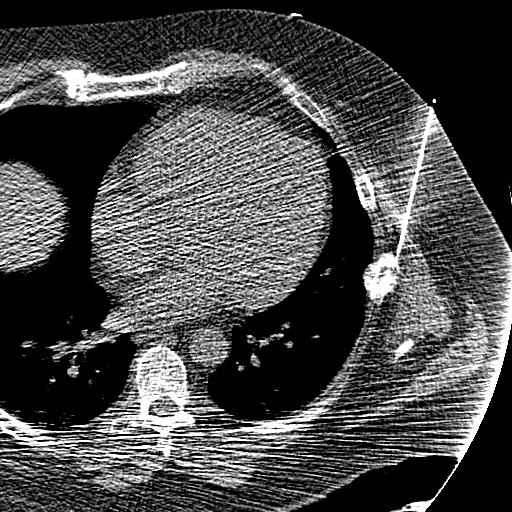
[im 16/20  soft-tissue]
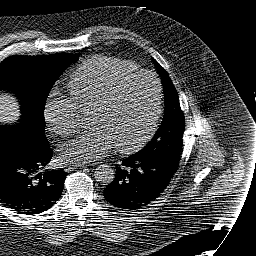
[im 17/20  soft-tissue]
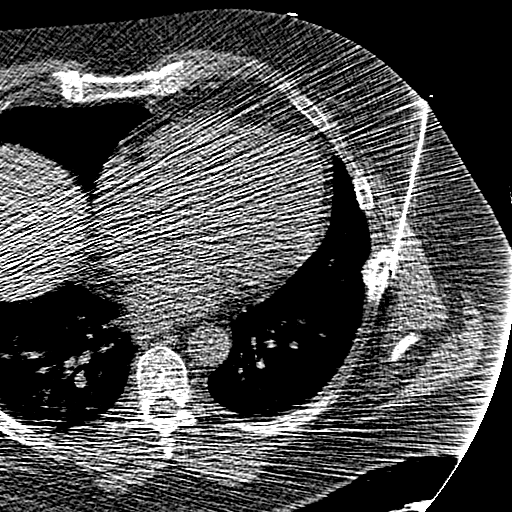
[im 18/20  soft-tissue]
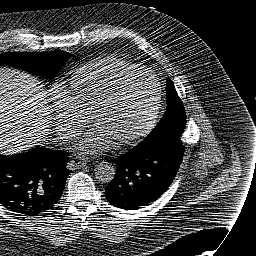
[im 18/20  bone]
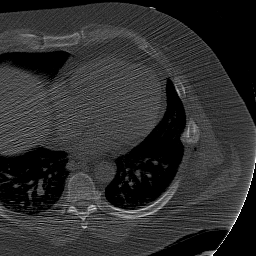
[im 19/20  soft-tissue]
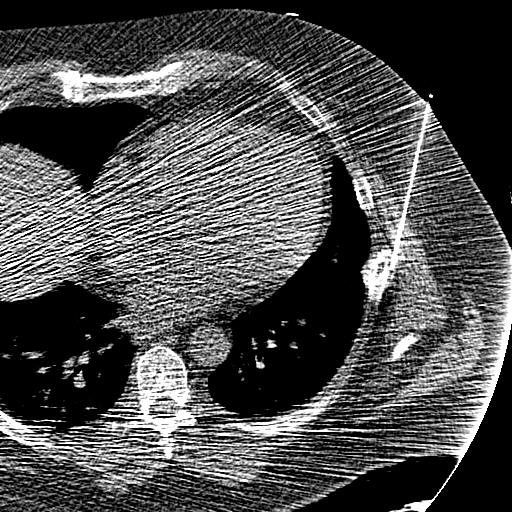

[18 of 22 positions shown; findings below may reference images not displayed]

CT GUIDED LEFT SEVENETH RIB LESION CORE NEEDLE BIOPSY

Comparisons: Chest CT - 09/21/2012; skeletal survey - 10/04/2012

Intravenous medications: Fentanyl 100 mcg IV; Versed 2 mg IV

Contrast: None

Sedation time: 20 minutes

Complications: None immediate

TECHNIQUE/FINDINGS:

Informed consent was obtained from the patient following an
explanation of the procedure, risks, benefits and alternatives.
The patient understands, agrees and consents for the procedure.
All questions were addressed.  A time out was performed prior to
the initiation of the procedure.

The patient was positioned supine on the CT table and a limited
chest CT was performed for procedural planning demonstrating
increased sclerosis about the previously identified nondisplaced
lateral left seventh rib fracture.  The now largely sclerotic
lesion measures approximately 2.8 x 2.2 cm (image 20, series 2).
The operative site was prepped and draped in the usual sterile
fashion.  Under sterile conditions and local anesthesia, a 17 gauge
coaxial needle was advanced into the peripheral aspect of the
nodule.  Positioning was confirmed with intermittent CT fluoroscopy
and 4 core needle biopsies were obtained with an 18 gauge core
needle biopsy device.

Limited post procedural chest CT was negative for pneumothorax or
additional complication.  The co-axial needle was removed and
hemostasis was achieved with manual compression.  A dressing was
placed.  The patient tolerated the procedure well without immediate
postprocedural complication.
IMPRESSION: Technically successful CT guided core needle biopsy of increasingly
sclerotic lesion adjacent to the previously identified nondisplaced
left sided seventh rib fracture.  Of note, this increased sclerosis
may represent interval callus formation.

## 2013-08-11 IMAGING — PT NM PET TUM IMG INITIAL (PI) SKULL BASE T - THIGH
1 of 6 series · 1 of 25 positions shown · non-contrast
Comparison: Skeletal survey of 10/04/1937.  Chest CT of
09/21/2012.

CLINICAL DATA: Initial treatment strategy for left rib lesion on
CT..

NUCLEAR MEDICINE PET SKULL BASE TO THIGH
Fasting Blood Glucose:  What is 78
TECHNIQUE: 18.5 mCi F-18 FDG was injected intravenously. CT data
was obtained and used for attenuation correction and anatomic
localization only.  (This was not acquired as a diagnostic CT
examination.) Additional exam technical data entered on
technologist worksheet.

[Series 2: ct images · axial · 3.8mm · 0.98mm/px · 1 of 300 slices shown]
[im 300/300  brain]
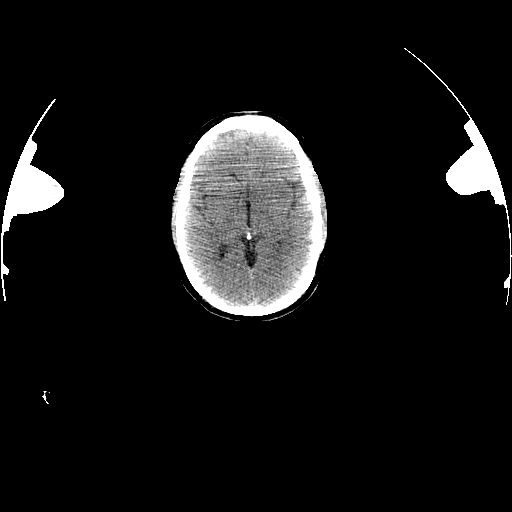

[1 of 25 positions shown; findings below may reference images not displayed]

FINDINGS: Neck: No abnormal hypermetabolism.

Chest:  No abnormal hypermetabolism.

Abdomen/Pelvis:  No abnormal hypermetabolism.

Skeleton:  Mild hypermetabolism corresponds to a lateral left
eighth rib callus deposition due to healing fracture on image
129/series 2.  This measures a S.U.V. max of 3.5.

 The previous described expansile process within the adjacent
lateral left seventh rib has undergone further sclerosis, most
consistent with healing.  Example image 115/series 2.  There is
also a healing fracture of the lateral left tenth rib on image
143/series 2.

CT  images performed for attenuation correction demonstrate mucous
retention cyst or polyps within bilateral maxillary
sinuses.Multivessel coronary artery atherosclerosis.  Mild
cardiomegaly.
IMPRESSION: 1.  Healing left seventh through ninth lateral rib fractures.  The
previously described abnormality within the left seventh rib was
secondary to early callus deposition.
2.  No evidence of primary malignancy or acute process.
3. Age advanced coronary artery atherosclerosis.  Recommend
assessment of coronary risk factors and consideration of medical
therapy.

## 2013-09-15 ENCOUNTER — Other Ambulatory Visit: Payer: Self-pay

## 2013-09-15 MED ORDER — LOSARTAN POTASSIUM 100 MG PO TABS: 100.0000 mg | ORAL_TABLET | Freq: Every evening | ORAL | Status: DC

## 2013-09-15 MED ORDER — FLECAINIDE ACETATE 100 MG PO TABS: 100.0000 mg | ORAL_TABLET | Freq: Two times a day (BID) | ORAL | Status: DC

## 2013-10-06 ENCOUNTER — Other Ambulatory Visit: Payer: Self-pay

## 2016-02-05 ENCOUNTER — Encounter: Payer: Self-pay | Admitting: Gastroenterology

## 2017-02-15 ENCOUNTER — Encounter (HOSPITAL_COMMUNITY): Payer: Self-pay | Admitting: Emergency Medicine

## 2017-02-15 ENCOUNTER — Inpatient Hospital Stay (HOSPITAL_COMMUNITY)
Admission: EM | Admit: 2017-02-15 | Discharge: 2017-02-17 | DRG: 247 | Disposition: A | Discharge status: Home / Self Care | Payer: Self-pay | Attending: Interventional Cardiology | Admitting: Interventional Cardiology

## 2017-02-15 ENCOUNTER — Encounter (HOSPITAL_COMMUNITY)
Admission: EM | Disposition: A | Discharge status: Home / Self Care | Payer: Self-pay | Source: Home / Self Care | Attending: Interventional Cardiology

## 2017-02-15 DIAGNOSIS — Z79899 Other long term (current) drug therapy: Secondary | ICD-10-CM

## 2017-02-15 DIAGNOSIS — I2510 Atherosclerotic heart disease of native coronary artery without angina pectoris: Secondary | ICD-10-CM

## 2017-02-15 DIAGNOSIS — I2119 ST elevation (STEMI) myocardial infarction involving other coronary artery of inferior wall: Principal | ICD-10-CM | POA: Diagnosis present

## 2017-02-15 DIAGNOSIS — I10 Essential (primary) hypertension: Secondary | ICD-10-CM | POA: Diagnosis present

## 2017-02-15 DIAGNOSIS — E782 Mixed hyperlipidemia: Secondary | ICD-10-CM | POA: Diagnosis present

## 2017-02-15 DIAGNOSIS — Z87891 Personal history of nicotine dependence: Secondary | ICD-10-CM

## 2017-02-15 DIAGNOSIS — E7800 Pure hypercholesterolemia, unspecified: Secondary | ICD-10-CM | POA: Diagnosis present

## 2017-02-15 DIAGNOSIS — E1021 Type 1 diabetes mellitus with diabetic nephropathy: Secondary | ICD-10-CM | POA: Diagnosis present

## 2017-02-15 DIAGNOSIS — G4733 Obstructive sleep apnea (adult) (pediatric): Secondary | ICD-10-CM | POA: Diagnosis present

## 2017-02-15 DIAGNOSIS — I2111 ST elevation (STEMI) myocardial infarction involving right coronary artery: Secondary | ICD-10-CM | POA: Diagnosis present

## 2017-02-15 DIAGNOSIS — Z8679 Personal history of other diseases of the circulatory system: Secondary | ICD-10-CM

## 2017-02-15 DIAGNOSIS — Z794 Long term (current) use of insulin: Secondary | ICD-10-CM

## 2017-02-15 DIAGNOSIS — I4892 Unspecified atrial flutter: Secondary | ICD-10-CM | POA: Diagnosis present

## 2017-02-15 DIAGNOSIS — Z955 Presence of coronary angioplasty implant and graft: Secondary | ICD-10-CM

## 2017-02-15 DIAGNOSIS — E118 Type 2 diabetes mellitus with unspecified complications: Secondary | ICD-10-CM

## 2017-02-15 DIAGNOSIS — I481 Persistent atrial fibrillation: Secondary | ICD-10-CM | POA: Diagnosis present

## 2017-02-15 HISTORY — PX: CORONARY/GRAFT ACUTE MI REVASCULARIZATION: CATH118305

## 2017-02-15 HISTORY — PX: LEFT HEART CATH AND CORONARY ANGIOGRAPHY: CATH118249

## 2017-02-15 LAB — CBC WITH DIFFERENTIAL/PLATELET
BASOS PCT: 0 %
Basophils Absolute: 0 10*3/uL (ref 0.0–0.1)
EOS PCT: 1 %
Eosinophils Absolute: 0.1 10*3/uL (ref 0.0–0.7)
HCT: 43.8 % (ref 39.0–52.0)
Hemoglobin: 14.6 g/dL (ref 13.0–17.0)
Lymphocytes Relative: 12 %
Lymphs Abs: 1.3 10*3/uL (ref 0.7–4.0)
MCH: 29.1 pg (ref 26.0–34.0)
MCHC: 33.3 g/dL (ref 30.0–36.0)
MCV: 87.3 fL (ref 78.0–100.0)
MONO ABS: 0.7 10*3/uL (ref 0.1–1.0)
Monocytes Relative: 7 %
Neutro Abs: 8.7 10*3/uL — ABNORMAL HIGH (ref 1.7–7.7)
Neutrophils Relative %: 80 %
PLATELETS: 213 10*3/uL (ref 150–400)
RBC: 5.02 MIL/uL (ref 4.22–5.81)
RDW: 13.5 % (ref 11.5–15.5)
WBC: 10.9 10*3/uL — ABNORMAL HIGH (ref 4.0–10.5)

## 2017-02-15 LAB — MRSA PCR SCREENING: MRSA BY PCR: NEGATIVE

## 2017-02-15 LAB — CK TOTAL AND CKMB (NOT AT ARMC)
CK TOTAL: 801 U/L — AB (ref 49–397)
CK, MB: 4.3 ng/mL (ref 0.5–5.0)
CK, MB: 93.4 ng/mL — ABNORMAL HIGH (ref 0.5–5.0)
Relative Index: 2.7 — ABNORMAL HIGH (ref 0.0–2.5)
Relative Index: 11.7 — ABNORMAL HIGH (ref 0.0–2.5)
Total CK: 162 U/L (ref 49–397)

## 2017-02-15 LAB — BASIC METABOLIC PANEL
ANION GAP: 9 (ref 5–15)
BUN: 10 mg/dL (ref 6–20)
CO2: 25 mmol/L (ref 22–32)
Calcium: 8.9 mg/dL (ref 8.9–10.3)
Chloride: 101 mmol/L (ref 101–111)
Creatinine, Ser: 0.77 mg/dL (ref 0.61–1.24)
Glucose, Bld: 207 mg/dL — ABNORMAL HIGH (ref 65–99)
Potassium: 4.1 mmol/L (ref 3.5–5.1)
SODIUM: 135 mmol/L (ref 135–145)

## 2017-02-15 LAB — POCT I-STAT TROPONIN I: TROPONIN I, POC: 0 ng/mL (ref 0.00–0.08)

## 2017-02-15 LAB — GLUCOSE, CAPILLARY: GLUCOSE-CAPILLARY: 159 mg/dL — AB (ref 65–99)

## 2017-02-15 LAB — PROTIME-INR
INR: 0.98
Prothrombin Time: 13 seconds (ref 11.4–15.2)

## 2017-02-15 SURGERY — LEFT HEART CATH AND CORONARY ANGIOGRAPHY
Anesthesia: LOCAL

## 2017-02-15 MED ORDER — IOPAMIDOL (ISOVUE-370) INJECTION 76%
INTRAVENOUS | Status: DC | PRN
  Administered 2017-02-15: 115 mL via INTRA_ARTERIAL

## 2017-02-15 MED ORDER — HYDRALAZINE HCL 20 MG/ML IJ SOLN: 5.0000 mg | INTRAMUSCULAR | Status: DC | PRN

## 2017-02-15 MED ORDER — HEPARIN SODIUM (PORCINE) 5000 UNIT/ML IJ SOLN
INTRAMUSCULAR | Status: AC
  Administered 2017-02-15: 4000 [IU]
  Filled 2017-02-15: qty 1

## 2017-02-15 MED ORDER — NITROGLYCERIN 1 MG/10 ML FOR IR/CATH LAB
INTRA_ARTERIAL | Status: AC
  Filled 2017-02-15: qty 10

## 2017-02-15 MED ORDER — TIROFIBAN HCL IV 12.5 MG/250 ML
INTRAVENOUS | Status: DC | PRN
  Administered 2017-02-15 (×2): .15 ug/kg/min via INTRAVENOUS

## 2017-02-15 MED ORDER — TIROFIBAN HCL IN NACL 5-0.9 MG/100ML-% IV SOLN
INTRAVENOUS | Status: AC
  Filled 2017-02-15: qty 100

## 2017-02-15 MED ORDER — SODIUM CHLORIDE 0.9 % IV SOLN
INTRAVENOUS | Status: DC
  Administered 2017-02-15 (×2): via INTRAVENOUS

## 2017-02-15 MED ORDER — ASPIRIN 81 MG PO CHEW
81.0000 mg | CHEWABLE_TABLET | Freq: Every day | ORAL | Status: DC
  Administered 2017-02-16 – 2017-02-17 (×2): 81 mg via ORAL
  Filled 2017-02-15 (×3): qty 1

## 2017-02-15 MED ORDER — SODIUM CHLORIDE 0.9% FLUSH
3.0000 mL | Freq: Two times a day (BID) | INTRAVENOUS | Status: DC
  Administered 2017-02-15 – 2017-02-16 (×3): 3 mL via INTRAVENOUS
  Administered 2017-02-17: 6 mL via INTRAVENOUS
  Administered 2017-02-17: 3 mL via INTRAVENOUS

## 2017-02-15 MED ORDER — TICAGRELOR 90 MG PO TABS
90.0000 mg | ORAL_TABLET | Freq: Two times a day (BID) | ORAL | Status: DC
  Administered 2017-02-15 – 2017-02-17 (×4): 90 mg via ORAL
  Filled 2017-02-15 (×4): qty 1

## 2017-02-15 MED ORDER — HYDRALAZINE HCL 20 MG/ML IJ SOLN: 10.0000 mg | INTRAMUSCULAR | Status: DC | PRN

## 2017-02-15 MED ORDER — TIROFIBAN HCL IN NACL 5-0.9 MG/100ML-% IV SOLN
0.1500 ug/kg/min | INTRAVENOUS | Status: DC
  Administered 2017-02-15 (×2): 0.15 ug/kg/min via INTRAVENOUS
  Filled 2017-02-15: qty 100

## 2017-02-15 MED ORDER — NITROGLYCERIN 1 MG/10 ML FOR IR/CATH LAB
INTRA_ARTERIAL | Status: DC | PRN
  Administered 2017-02-15: 200 ug

## 2017-02-15 MED ORDER — MIDAZOLAM HCL 2 MG/2ML IJ SOLN
INTRAMUSCULAR | Status: AC
  Filled 2017-02-15: qty 2

## 2017-02-15 MED ORDER — ONDANSETRON HCL 4 MG/2ML IJ SOLN: 4.0000 mg | Freq: Four times a day (QID) | INTRAMUSCULAR | Status: DC | PRN

## 2017-02-15 MED ORDER — METOPROLOL TARTRATE 12.5 MG HALF TABLET
12.5000 mg | ORAL_TABLET | Freq: Two times a day (BID) | ORAL | Status: DC
  Administered 2017-02-15 – 2017-02-17 (×5): 12.5 mg via ORAL
  Filled 2017-02-15 (×5): qty 1

## 2017-02-15 MED ORDER — FENTANYL CITRATE (PF) 100 MCG/2ML IJ SOLN
INTRAMUSCULAR | Status: AC
  Filled 2017-02-15: qty 2

## 2017-02-15 MED ORDER — TICAGRELOR 90 MG PO TABS
ORAL_TABLET | ORAL | Status: DC | PRN
  Administered 2017-02-15: 180 mg via ORAL

## 2017-02-15 MED ORDER — HEPARIN SODIUM (PORCINE) 5000 UNIT/ML IJ SOLN: 4000.0000 [IU] | Freq: Once | INTRAMUSCULAR | Status: DC

## 2017-02-15 MED ORDER — HEPARIN SODIUM (PORCINE) 1000 UNIT/ML IJ SOLN
INTRAMUSCULAR | Status: AC
  Filled 2017-02-15: qty 1

## 2017-02-15 MED ORDER — TICAGRELOR 90 MG PO TABS
ORAL_TABLET | ORAL | Status: AC
  Filled 2017-02-15: qty 2

## 2017-02-15 MED ORDER — LIDOCAINE HCL (PF) 1 % IJ SOLN
INTRAMUSCULAR | Status: DC | PRN
  Administered 2017-02-15: 2 mL

## 2017-02-15 MED ORDER — LABETALOL HCL 5 MG/ML IV SOLN: 10.0000 mg | INTRAVENOUS | Status: AC | PRN

## 2017-02-15 MED ORDER — MIDAZOLAM HCL 2 MG/2ML IJ SOLN
INTRAMUSCULAR | Status: DC | PRN
  Administered 2017-02-15: 1 mg via INTRAVENOUS

## 2017-02-15 MED ORDER — FENTANYL CITRATE (PF) 100 MCG/2ML IJ SOLN
INTRAMUSCULAR | Status: DC | PRN
  Administered 2017-02-15: 50 ug via INTRAVENOUS
  Administered 2017-02-15: 25 ug via INTRAVENOUS

## 2017-02-15 MED ORDER — HEPARIN SODIUM (PORCINE) 1000 UNIT/ML IJ SOLN
INTRAMUSCULAR | Status: DC | PRN
  Administered 2017-02-15: 6000 [IU] via INTRAVENOUS

## 2017-02-15 MED ORDER — INSULIN ASPART 100 UNIT/ML ~~LOC~~ SOLN
0.0000 [IU] | Freq: Three times a day (TID) | SUBCUTANEOUS | Status: DC
  Administered 2017-02-15 – 2017-02-16 (×2): 3 [IU] via SUBCUTANEOUS

## 2017-02-15 MED ORDER — LOSARTAN POTASSIUM 25 MG PO TABS
25.0000 mg | ORAL_TABLET | Freq: Every evening | ORAL | Status: DC
  Administered 2017-02-15: 25 mg via ORAL
  Filled 2017-02-15: qty 1

## 2017-02-15 MED ORDER — HEPARIN (PORCINE) IN NACL 2-0.9 UNIT/ML-% IJ SOLN
INTRAMUSCULAR | Status: DC | PRN
  Administered 2017-02-15: 8 mL via INTRA_ARTERIAL

## 2017-02-15 MED ORDER — VERAPAMIL HCL 2.5 MG/ML IV SOLN
INTRAVENOUS | Status: AC
  Filled 2017-02-15: qty 2

## 2017-02-15 MED ORDER — HEPARIN (PORCINE) IN NACL 2-0.9 UNIT/ML-% IJ SOLN
INTRAMUSCULAR | Status: AC
  Filled 2017-02-15: qty 1000

## 2017-02-15 MED ORDER — SODIUM CHLORIDE 0.9% FLUSH: 3.0000 mL | INTRAVENOUS | Status: DC | PRN

## 2017-02-15 MED ORDER — SODIUM CHLORIDE 0.9 % IV SOLN: 250.0000 mL | INTRAVENOUS | Status: DC | PRN

## 2017-02-15 MED ORDER — ACETAMINOPHEN 325 MG PO TABS: 650.0000 mg | ORAL_TABLET | ORAL | Status: DC | PRN

## 2017-02-15 MED ORDER — ATORVASTATIN CALCIUM 80 MG PO TABS: 80.0000 mg | ORAL_TABLET | Freq: Every day | ORAL | Status: DC

## 2017-02-15 MED ORDER — HEPARIN (PORCINE) IN NACL 2-0.9 UNIT/ML-% IJ SOLN
INTRAMUSCULAR | Status: DC | PRN
  Administered 2017-02-15: 1500 mL

## 2017-02-15 MED ORDER — TIROFIBAN (AGGRASTAT) BOLUS VIA INFUSION
INTRAVENOUS | Status: DC | PRN
  Administered 2017-02-15: 3550 ug via INTRAVENOUS

## 2017-02-15 MED ORDER — LIDOCAINE HCL (PF) 1 % IJ SOLN
INTRAMUSCULAR | Status: AC
  Filled 2017-02-15: qty 30

## 2017-02-15 SURGICAL SUPPLY — 19 items
BALLN MOZEC 2.50X14 (BALLOONS) ×2
BALLN ~~LOC~~ MOZEC 3.5X13 (BALLOONS) ×2
BALLOON MOZEC 2.50X14 (BALLOONS) IMPLANT
BALLOON ~~LOC~~ MOZEC 3.5X13 (BALLOONS) IMPLANT
CATH EXPO 5FR ANG PIGTAIL 145 (CATHETERS) ×1 IMPLANT
CATH INFINITI 5 FR JL3.5 (CATHETERS) ×1 IMPLANT
DEVICE RAD COMP TR BAND LRG (VASCULAR PRODUCTS) ×1 IMPLANT
GLIDESHEATH SLEND SS 6F .021 (SHEATH) ×1 IMPLANT
GUIDEWIRE INQWIRE 1.5J.035X260 (WIRE) IMPLANT
INQWIRE 1.5J .035X260CM (WIRE) ×2
KIT ENCORE 26 ADVANTAGE (KITS) ×2 IMPLANT
KIT HEART LEFT (KITS) ×2 IMPLANT
PACK CARDIAC CATHETERIZATION (CUSTOM PROCEDURE TRAY) ×2 IMPLANT
STENT SYNERGY DES 3X20 (Permanent Stent) ×1 IMPLANT
SYR MEDRAD MARK V 150ML (SYRINGE) ×2 IMPLANT
TRANSDUCER W/STOPCOCK (MISCELLANEOUS) ×2 IMPLANT
TUBING CIL FLEX 10 FLL-RA (TUBING) ×2 IMPLANT
VALVE GUARDIAN II ~~LOC~~ HEMO (MISCELLANEOUS) ×1 IMPLANT
WIRE ASAHI PROWATER 180CM (WIRE) ×2 IMPLANT

## 2017-02-15 NOTE — ED Notes (Signed)
Pt taken to Cath lab with this RN and pro pads applied with ZOLL. Bed side report given to cath lab.

## 2017-02-15 NOTE — H&P (Addendum)
Derek Herrera is an 56 y.o. male.   Primary Cardiologist: Allred PMD:  Chief Complaint: chest pain HPI: 56 y/o who has had DM for nearly 20 years.  He has a h/o atrial flutter. He was cardioverted several years ago.  He took blood thinners for a time after his cardioversion. He has not been seen by cardiology in some time.  He had been in his usual state of health. Earlier today at about noon, he developed some tightness in his chest and under both arms. He went to urinate and felt very dizzy. Because of these symptoms, he called EMS. Initial ECG showed marked ST elevation inferiorly with lateral ST depressions. He came to the ER. His symptoms are slightly improved in the ST elevation was slightly improved on his ECG. He continues to have discomfort.  Past Medical History:  Diagnosis Date  . Anemia   . Clotting disorder   . DIABETES MELLITUS, TYPE I 06/29/2007  . DM nephropathy/sclerosis   . HYPERCHOLESTEROLEMIA 05/20/2010  . HYPERTENSION 06/29/2007  . Lytic lesion of bone on x-ray 09/29/2012   Expansile lesion Left lateral rib on CT 09/23/12  Chest otherwise negative  . Obstructive sleep apnea    moderate by sleep study 11/13  . Persistent atrial fibrillation     Past Surgical History:  Procedure Laterality Date  . CARDIOVERSION  09/03/2012   Procedure: CARDIOVERSION;  Surgeon: Fay Records, MD;  Location: [facility] ENDOSCOPY;  Service: Cardiovascular;  Laterality: N/A;  left message with leb RN to clarify if mcalhaney is doing this procedure/dl/10 2  . KNEE SURGERY     x's 3  . TEE WITH CARDIOVERSION  08/02/2011   EF of approximately 50-55% conversion to sinus rhythm.  There were no apparent complications  . WRIST FRACTURE SURGERY  right    Family History  Problem Relation Age of Onset  . Diabetes Maternal Grandmother   . Cancer Paternal Grandfather   . Bone cancer Maternal Grandmother    Social History:  reports that he quit smoking about 26 years ago. His smoking use included  Cigarettes. He has a 20.00 pack-year smoking history. He does not have any smokeless tobacco history on file. He reports that he does not drink alcohol or use drugs.  Allergies: No Known Allergies  Medications Prior to Admission  Medication Sig Dispense Refill  . diltiazem (CARDIZEM CD) 360 MG 24 hr capsule Take 360 mg by mouth daily after lunch.    . flecainide (TAMBOCOR) 100 MG tablet Take 1 tablet (100 mg total) by mouth 2 (two) times daily. 60 tablet 1  . insulin aspart (NOVOLOG) 100 UNIT/ML injection Inject 45-95 Units into the skin 2 (two) times daily before a meal. Take 45 units with breakfast, and 95 units with the evening meal    . insulin glargine (LANTUS) 100 UNIT/ML injection Inject 75-80 Units into the skin at bedtime.    Marland Kitchen losartan (COZAAR) 100 MG tablet Take 1 tablet (100 mg total) by mouth every evening. 30 tablet 6  . oxyCODONE-acetaminophen (PERCOCET/ROXICET) 5-325 MG per tablet Take 1-2 tablets by mouth every 6 (six) hours as needed for pain. 20 tablet 0  . Rivaroxaban (XARELTO) 20 MG TABS Take 20 mg by mouth daily. 30 tablet 10  . simvastatin (ZOCOR) 80 MG tablet Take 0.5 tablets (40 mg total) by mouth at bedtime. 15 tablet 4  . traMADol (ULTRAM) 50 MG tablet Take 50 mg by mouth every 8 (eight) hours as needed. For pain  No results found for this or any previous visit (from the past 48 hour(s)). No results found.  ROS: Significant for arm pain noted above- improved.  Dizziness earlier. No bleeding issues.  All other systems negative.   OBJECTIVE:   Vitals:  There were no vitals filed for this visit. I&O's:  No intake or output data in the 24 hours ending 02/15/17 1345 TELEMETRY: Reviewed telemetry pt in *:Normal sinus rhythm     PHYSICAL EXAM General: Well developed, well nourished, in no acute distress Head:   Normal cephalic and atramatic  Lungs:   No wheezing.  Heart:   HRRR S1 S2  No JVD.   Abdomen: abdomen soft and non-tender Msk:  Back normal,  Normal  strength and tone for age.  Scar on the right wrist ,  Extremities:   No edema.  2+ dorsalis pedis pulses bilaterally Neuro: Alert and oriented. Psych:  Normal affect, responds appropriately No rash,   LABS: Basic Metabolic Panel: No results for input(s): NA, K, CL, CO2, GLUCOSE, BUN, CREATININE, CALCIUM, MG, PHOS in the last 72 hours. Liver Function Tests: No results for input(s): AST, ALT, ALKPHOS, BILITOT, PROT, ALBUMIN in the last 72 hours. No results for input(s): LIPASE, AMYLASE in the last 72 hours. CBC: No results for input(s): WBC, NEUTROABS, HGB, HCT, MCV, PLT in the last 72 hours. Cardiac Enzymes: No results for input(s): CKTOTAL, CKMB, CKMBINDEX, TROPONINI in the last 72 hours. BNP: Invalid input(s): POCBNP D-Dimer: No results for input(s): DDIMER in the last 72 hours. Hemoglobin A1C: No results for input(s): HGBA1C in the last 72 hours. Fasting Lipid Panel: No results for input(s): CHOL, HDL, LDLCALC, TRIG, CHOLHDL, LDLDIRECT in the last 72 hours. Thyroid Function Tests: No results for input(s): TSH, T4TOTAL, T3FREE, THYROIDAB in the last 72 hours.  Invalid input(s): FREET3 Anemia Panel: No results for input(s): VITAMINB12, FOLATE, FERRITIN, TIBC, IRON, RETICCTPCT in the last 72 hours. Coag Panel:   Lab Results  Component Value Date   INR 0.98 10/20/2012   INR 2.01 (H) 08/27/2012       Assessment/Plan Acute acute  inferior ST elevation MI in a diabetic. I personally reviewed the ECGs and examined the patient.  [place] made the decision for him to come to the cath lab.  Plan for emergency cardiac cath. Further plans based on cath results.  He'll need aggressive diabetes control. He is artery quit smoking many years ago. He'll need aggressive lipid lowering therapy.  He states he uses over-the-counter insulin and just paces the dose on what he eats and how active he is. Will check hemoglobin A1c.  It should be noted that the meds listed above are actually  what he was on when he was discharged in 2013, 2014.  He is only taking insulin.  Several years ago, he had atrial flutter. He was treated with cardioversion. He had been on multiple medications for blood pressure, anticoagulation and rhythm control including flecainide. From the wife, she tells me that he is not taking any of these pills. His blood pressure is controlled at home. He does try to walk regularly.  He also had some type of questionable x-ray with there is concern for lytic lesion. He was told that per Dr. Beryle Beams, there was no sign of cancer.  No prior bleeding problems. No prior stroke or TIA. No elective surgery coming up.  Larae Grooms 02/15/2017, 1:45 PM

## 2017-02-15 NOTE — Progress Notes (Signed)
eLink Physician-Brief Progress Note Patient Name: Derek Herrera DOB: 1961-03-25 MRN: [id number]   Date of Service  02/15/2017  HPI/Events of Note  New patient eval chest pain Stable on camera  eICU Interventions  No eICU intervention needed     Intervention Category Evaluation Type: New Patient Evaluation  Simonne Maffucci 02/15/2017, 3:40 PM

## 2017-02-15 NOTE — Progress Notes (Signed)
Right radial TR band removed, pressure dressing applied, site level 0 and bilateral radial pulses +2.  Patient instructed on arm movement restrictions.  Will continue to monitor. [place], Ardeth Sportsman

## 2017-02-15 NOTE — ED Triage Notes (Signed)
Pt arrives emergence traffic as a code stemi from [facility].  Pt had a sudden onset of bilateral arm pain and jaw pain 2 hrs PTA. Ems gave 324mg  ASA en route. On arrival pt denies any chest pain but 5/10 bilateral arm and jaw pain.

## 2017-02-15 NOTE — Progress Notes (Signed)
Chaplain received Code [employer]. Upon arrival to the ED the patient was taken to the Cath Lab. Chaplain met with the patients family in the Cath waiting area, comfort measures were offered, and words of encouragement. The Cath Lab staff was informed of their presence, and the staff informed the family of the patient's room assignment. Chaplain will follow up at a later time. For on going spiritual care support.

## 2017-02-15 NOTE — ED Provider Notes (Signed)
[place] DEPT Provider Note   CSN: [id number] Arrival date & time: 02/15/17  1322     History   Chief Complaint Chief Complaint  Patient presents with  . Code STEMI    HPI Derek Herrera is a 56 y.o. male. Chief complaint is tightness and armpits.  HPI:  56 year old male. 19 year history of diabetes. Insulin-dependent for the last 8. No history of cardiac disease or cardiac evaluations other than previous atrial fibrillation status post cardioversion.  Consent morning feeling weakness and "just not right" no chest pain. No shortness of breath. Some mild nausea and occasionally diaphoresis during the day. He was at church. At 1145 he was walking out of church and developed tightness between his arms under his armpits into his left jaw. No chest pain. No syncope or presyncope. He presents via EMS. Prehospital lead EKG shows ST elevations inferiorly. Code STEMI appropriately by EMS    Past Medical History:  Diagnosis Date  . Anemia   . Clotting disorder ([place])   . DIABETES MELLITUS, TYPE I 06/29/2007  . DM nephropathy/sclerosis   . HYPERCHOLESTEROLEMIA 05/20/2010  . HYPERTENSION 06/29/2007  . Lytic lesion of bone on x-ray 09/29/2012   Expansile lesion Left lateral rib on CT 09/23/12  Chest otherwise negative  . Obstructive sleep apnea    moderate by sleep study 11/13  . Persistent atrial fibrillation [facility])     Patient Active Problem List   Diagnosis Date Noted  . Lytic lesion of bone on x-ray 09/29/2012  . Atrial fibrillation ([place]) 09/03/2011  . Acute bronchitis 09/03/2011  . Obstructive sleep apnea 09/03/2011  . URI 12/24/2010  . HYPERCHOLESTEROLEMIA 05/20/2010  . PROTEINURIA, MILD 05/20/2010  . DIABETES MELLITUS, TYPE I 06/29/2007  . HYPERTENSION 06/29/2007    Past Surgical History:  Procedure Laterality Date  . CARDIOVERSION  09/03/2012   Procedure: CARDIOVERSION;  Surgeon: Fay Records, MD;  Location: [facility] ENDOSCOPY;  Service: Cardiovascular;  Laterality:  N/A;  left message with leb RN to clarify if mcalhaney is doing this procedure/dl/10 2  . KNEE SURGERY     x's 3  . TEE WITH CARDIOVERSION  08/02/2011   EF of approximately 50-55% conversion to sinus rhythm.  There were no apparent complications  . WRIST FRACTURE SURGERY  right       Home Medications    Prior to Admission medications   Medication Sig Start Date End Date Taking? Authorizing Provider  diltiazem (CARDIZEM CD) 360 MG 24 hr capsule Take 360 mg by mouth daily after lunch.    Historical Provider, MD  flecainide (TAMBOCOR) 100 MG tablet Take 1 tablet (100 mg total) by mouth 2 (two) times daily. 09/15/13   Thompson Grayer, MD  insulin aspart (NOVOLOG) 100 UNIT/ML injection Inject 45-95 Units into the skin 2 (two) times daily before a meal. Take 45 units with breakfast, and 95 units with the evening meal 10/01/12   Renato Shin, MD  insulin glargine (LANTUS) 100 UNIT/ML injection Inject 75-80 Units into the skin at bedtime. 08/25/11   Renato Shin, MD  losartan (COZAAR) 100 MG tablet Take 1 tablet (100 mg total) by mouth every evening. 09/15/13   Thompson Grayer, MD  oxyCODONE-acetaminophen (PERCOCET/ROXICET) 5-325 MG per tablet Take 1-2 tablets by mouth every 6 (six) hours as needed for pain. 11/16/12   Veryl Speak, MD  Rivaroxaban (XARELTO) 20 MG TABS Take 20 mg by mouth daily. 03/12/12   Thompson Grayer, MD  simvastatin (ZOCOR) 80 MG tablet Take 0.5 tablets (40 mg  total) by mouth at bedtime. 01/18/13   Renato Shin, MD  traMADol (ULTRAM) 50 MG tablet Take 50 mg by mouth every 8 (eight) hours as needed. For pain 10/14/12   Doe-Hyun Kyra Searles, DO    Family History Family History  Problem Relation Age of Onset  . Diabetes Maternal Grandmother   . Bone cancer Maternal Grandmother   . Cancer Paternal Grandfather     Social History Social History  Substance Use Topics  . Smoking status: Former Smoker    Packs/day: 1.00    Years: 20.00    Types: Cigarettes    Quit date: 12/01/1990  .  Smokeless tobacco: Not on file     Comment: smokked off an on x 20 yrs 09/20/12  . Alcohol use No     Allergies   Patient has no known allergies.   Review of Systems Review of Systems  Constitutional: Positive for diaphoresis and fatigue. Negative for appetite change, chills and fever.  HENT: Negative for mouth sores, sore throat and trouble swallowing.   Eyes: Negative for visual disturbance.  Respiratory: Positive for shortness of breath. Negative for cough, chest tightness and wheezing.   Cardiovascular: Negative for chest pain.       "Armpit pain". Radiates to left jaw.  Gastrointestinal: Negative for abdominal distention, abdominal pain, diarrhea, nausea and vomiting.  Endocrine: Negative for polydipsia, polyphagia and polyuria.  Genitourinary: Negative for dysuria, frequency and hematuria.  Musculoskeletal: Negative for gait problem.  Skin: Negative for color change, pallor and rash.  Neurological: Positive for weakness. Negative for dizziness, syncope, light-headedness and headaches.  Hematological: Does not bruise/bleed easily.  Psychiatric/Behavioral: Negative for behavioral problems and confusion.     Physical Exam Updated Vital Signs BP 140/60 (BP Location: Right Arm)   Pulse 80   Temp 98.8 F (37.1 C) (Oral)   Resp 19   SpO2 99%   Physical Exam  Constitutional: He is oriented to person, place, and time. He appears well-developed and well-nourished. No distress.  56 year old male awake and alert. Not pale or diaphoretic. Denies chest pain  HENT:  Head: Normocephalic.  Eyes: Conjunctivae are normal. Pupils are equal, round, and reactive to light. No scleral icterus.  Neck: Normal range of motion. Neck supple. No thyromegaly present.  Cardiovascular: Normal rate and regular rhythm.  Exam reveals no gallop and no friction rub.   No murmur heard. Pulmonary/Chest: Effort normal and breath sounds normal. No respiratory distress. He has no wheezes. He has no rales.    Abdominal: Soft. Bowel sounds are normal. He exhibits no distension. There is no tenderness. There is no rebound.  Musculoskeletal: Normal range of motion.  Neurological: He is alert and oriented to person, place, and time.  Skin: Skin is warm and dry. No rash noted.  Psychiatric: He has a normal mood and affect. His behavior is normal.     ED Treatments / Results  Labs (all labs ordered are listed, but only abnormal results are displayed) Labs Reviewed  CBC WITH DIFFERENTIAL/PLATELET - Abnormal; Notable for the following:       Result Value   WBC 10.9 (*)    Neutro Abs 8.7 (*)    All other components within normal limits  BASIC METABOLIC PANEL - Abnormal; Notable for the following:    Glucose, Bld 207 (*)    All other components within normal limits  PROTIME-INR  POCT I-STAT TROPONIN I  I-STAT TROPOININ, ED    EKG  EKG Interpretation None  Radiology No results found.  Procedures Procedures (including critical care time)  Medications Ordered in ED Medications  heparin injection 4,000 Units ( Intravenous Automatically Held 02/15/17 1345)  lidocaine (PF) (XYLOCAINE) 1 % injection (2 mLs  Given 02/15/17 1357)  Radial Cocktail/Verapamil only (8 mLs Intra-arterial Given 02/15/17 1400)  heparin injection (6,000 Units Intravenous Given 02/15/17 1402)  fentaNYL (SUBLIMAZE) injection (50 mcg Intravenous Given 02/15/17 1420)  midazolam (VERSED) injection (1 mg Intravenous Given 02/15/17 1406)  tirofiban (AGGRASTAT) bolus via infusion (3,550 mcg Intravenous Given 02/15/17 1406)  tirofiban (AGGRASTAT) infusion 50 mcg/mL 250 mL (0.15 mcg/kg/min  142 kg (Order-Specific) Intravenous New Bag/Given 02/15/17 1411)  nitroGLYCERIN 1 mg/10 ml (100 mcg/ml) - IR/CATH LAB (200 mcg  Given 02/15/17 1418)  ticagrelor (BRILINTA) tablet (180 mg Oral Given 02/15/17 1423)  iopamidol (ISOVUE-370) 76 % injection (115 mLs Intra-arterial Given 02/15/17 1425)  heparin 5000 UNIT/ML injection (4,000 Units   Given 02/15/17 1358)     Initial Impression / Assessment and Plan / ED Course  I have reviewed the triage vital signs and the nursing notes.  Pertinent labs & imaging results that were available during my care of the patient were reviewed by me and considered in my medical decision making (see chart for details).     I have reviewed the prehospital EKG and agree with initiation of code STEMI. Patient seen upon arrival. IV access obtained. Given 4000 units of IV heparin. I discussed the case with Dr. Irish Lack. He was en route immediately and actually entering the hospital at the time of my phone call. Patient will be taken directly to Cath Lab for presumed angiogram and PTCA.  She had been given aspirin prior to arrival. His rhythm remained stable. Repeat EKG here shows decrease ST elevations but still present with 1 mm of elevation and lateral limb lead reciprocal changes.  Final Clinical Impressions(s) / ED Diagnoses   Final diagnoses:  ST elevation myocardial infarction involving right coronary artery ([place])    CRITICAL CARE Performed by: Tanna Furry JOSEPH   Total critical care time: 30 minutes. This includes initial call activation of STEMI. I left the care of another patient to assume this patient's care upon arrival. It was present in the room until patient was escorted to Cath Lab by staff and cardiologist. Bolus given.  Critical care time was exclusive of separately billable procedures and treating other patients.  Critical care was necessary to treat or prevent imminent or life-threatening deterioration.  Critical care was time spent personally by me on the following activities: development of treatment plan with patient and/or surrogate as well as nursing, discussions with consultants, evaluation of patient's response to treatment, examination of patient, obtaining history from patient or surrogate, ordering and performing treatments and interventions, ordering and review of  laboratory studies, ordering and review of radiographic studies, pulse oximetry and re-evaluation of patient's condition.   New Prescriptions Current Discharge Medication List       Tanna Furry, MD 02/15/17 1434

## 2017-02-16 ENCOUNTER — Encounter (HOSPITAL_COMMUNITY): Payer: Self-pay | Admitting: Interventional Cardiology

## 2017-02-16 DIAGNOSIS — I10 Essential (primary) hypertension: Secondary | ICD-10-CM

## 2017-02-16 DIAGNOSIS — I2119 ST elevation (STEMI) myocardial infarction involving other coronary artery of inferior wall: Principal | ICD-10-CM

## 2017-02-16 DIAGNOSIS — E782 Mixed hyperlipidemia: Secondary | ICD-10-CM | POA: Diagnosis present

## 2017-02-16 DIAGNOSIS — E118 Type 2 diabetes mellitus with unspecified complications: Secondary | ICD-10-CM

## 2017-02-16 DIAGNOSIS — Z794 Long term (current) use of insulin: Secondary | ICD-10-CM

## 2017-02-16 LAB — CBC
HCT: 42 % (ref 39.0–52.0)
Hemoglobin: 13.9 g/dL (ref 13.0–17.0)
MCH: 28.9 pg (ref 26.0–34.0)
MCHC: 33.1 g/dL (ref 30.0–36.0)
MCV: 87.3 fL (ref 78.0–100.0)
Platelets: 205 10*3/uL (ref 150–400)
RBC: 4.81 MIL/uL (ref 4.22–5.81)
RDW: 13.9 % (ref 11.5–15.5)
WBC: 9.4 10*3/uL (ref 4.0–10.5)

## 2017-02-16 LAB — GLUCOSE, CAPILLARY
GLUCOSE-CAPILLARY: 217 mg/dL — AB (ref 65–99)
Glucose-Capillary: 171 mg/dL — ABNORMAL HIGH (ref 65–99)
Glucose-Capillary: 160 mg/dL — ABNORMAL HIGH (ref 65–99)

## 2017-02-16 LAB — CK TOTAL AND CKMB (NOT AT ARMC)
CK TOTAL: 647 U/L — AB (ref 49–397)
CK, MB: 66.6 ng/mL — ABNORMAL HIGH (ref 0.5–5.0)
RELATIVE INDEX: 10.3 — AB (ref 0.0–2.5)

## 2017-02-16 LAB — GLUCOSE, RANDOM: Glucose, Bld: 136 mg/dL — ABNORMAL HIGH (ref 65–99)

## 2017-02-16 LAB — BASIC METABOLIC PANEL
Anion gap: 9 (ref 5–15)
BUN: 8 mg/dL (ref 6–20)
CALCIUM: 8.7 mg/dL — AB (ref 8.9–10.3)
CHLORIDE: 108 mmol/L (ref 101–111)
CO2: 24 mmol/L (ref 22–32)
CREATININE: 0.59 mg/dL — AB (ref 0.61–1.24)
GFR calc non Af Amer: >60 mL/min (ref >60)
Glucose, Bld: 180 mg/dL — ABNORMAL HIGH (ref 65–99)
Potassium: 3.7 mmol/L (ref 3.5–5.1)
SODIUM: 141 mmol/L (ref 135–145)

## 2017-02-16 LAB — LIPID PANEL
CHOL/HDL RATIO: 5 ratio
CHOLESTEROL: 190 mg/dL (ref 0–200)
HDL: 38 mg/dL — AB (ref >40)
LDL CALC: 129 mg/dL — AB (ref 0–99)
TRIGLYCERIDES: 115 mg/dL (ref <150)
VLDL: 23 mg/dL (ref 0–40)

## 2017-02-16 LAB — POCT I-STAT, CHEM 8
BUN: 10 mg/dL (ref 6–20)
CHLORIDE: 103 mmol/L (ref 101–111)
CREATININE: 0.5 mg/dL — AB (ref 0.61–1.24)
Calcium, Ion: 1.21 mmol/L (ref 1.15–1.40)
GLUCOSE: 201 mg/dL — AB (ref 65–99)
HCT: 41 % (ref 39.0–52.0)
Hemoglobin: 13.9 g/dL (ref 13.0–17.0)
POTASSIUM: 3.7 mmol/L (ref 3.5–5.1)
Sodium: 139 mmol/L (ref 135–145)
TCO2: 24 mmol/L (ref 0–100)

## 2017-02-16 LAB — POCT ACTIVATED CLOTTING TIME: ACTIVATED CLOTTING TIME: 274 s

## 2017-02-16 MED ORDER — INSULIN ASPART 100 UNIT/ML ~~LOC~~ SOLN
0.0000 [IU] | Freq: Three times a day (TID) | SUBCUTANEOUS | Status: DC
  Administered 2017-02-16: 4 [IU] via SUBCUTANEOUS

## 2017-02-16 MED ORDER — INSULIN ASPART 100 UNIT/ML ~~LOC~~ SOLN
8.0000 [IU] | Freq: Three times a day (TID) | SUBCUTANEOUS | Status: DC
  Administered 2017-02-16: 8 [IU] via SUBCUTANEOUS

## 2017-02-16 MED ORDER — INSULIN ASPART 100 UNIT/ML ~~LOC~~ SOLN: 0.0000 [IU] | Freq: Three times a day (TID) | SUBCUTANEOUS | Status: DC

## 2017-02-16 MED ORDER — INSULIN ASPART 100 UNIT/ML ~~LOC~~ SOLN: 0.0000 [IU] | Freq: Every day | SUBCUTANEOUS | Status: DC

## 2017-02-16 NOTE — Care Management Note (Signed)
Case Management Note  Patient Details  Name: Derek Herrera MRN: [id number] Date of Birth: 06/10/1961  Subjective/Objective:      Pt admitted with CP              Action/Plan:  PTA independent from home.  Pt has established relationship with PCP Claris Gower in [place] and does not wish to switch even though he is not offered medication assistance at that office.  CM explained that we could set him up with [facility] and there he can request medication assistance but pt declined due to his relationship with PCP.  Pt stated attending and pharmacy dicussed NOAC options with him and he was informed he could use Plavix verses Brilinta- pt is opting to use Plavix and continue to pay out of pocket.  Pt informed CM that he would rather continue to pay out of pocket than switch providers.  CM offered to provide pt with Brilinta assistance information - pt declined and stated "I will go home on Plavix".   CM text paged attending.  CM will continue to follow for discharge needs   Expected Discharge Date:                  Expected Discharge Plan:  Home/Self Care  In-House Referral:     Discharge planning Services  CM Consult  Post Acute Care Choice:    Choice offered to:     DME Arranged:    DME Agency:     HH Arranged:    HH Agency:     Status of Service:  In process, will continue to follow  If discussed at Long Length of Stay Meetings, dates discussed:    Additional Comments:  Maryclare Labrador, RN 02/16/2017, 3:42 PM

## 2017-02-16 NOTE — Progress Notes (Signed)
CARDIAC REHAB PHASE I   PRE:  Rate/Rhythm: 90 SR    BP: sitting 137/83    SaO2: 100 RA  MODE:  Ambulation: 470 ft   POST:  Rate/Rhythm: 98 SR with PVCs    BP: sitting 142/83     SaO2: 99 RA  Tolerated well, no c/o. Pt is rather active and exercises frequently. He watches his diet and checks his blood sugar 7+ times a day. Discussed MI, stent, restrictions, meds (brilinta/asa), diet, ex, NTG, and CRPII. He and his wife voice understanding but do seem overwhelmed, mostly due to meds and finances. Will refer to [place]. Encouraged more walking this pm. [id number]  [place], ACSM 02/16/2017 12:07 PM

## 2017-02-16 NOTE — Progress Notes (Addendum)
Progress Note  Patient Name: Derek Herrera Date of Encounter: 02/16/2017  Primary Cardiologist: Allred  Subjective   No chest/arm pain.  Feels well.  Concerned about the cost of meds as he is uninsured.  He prefers generics.  THis has influenced the type of insulin he uses.   Inpatient Medications    Scheduled Meds: . aspirin  81 mg Oral Daily  . atorvastatin  80 mg Oral q1800  . heparin  4,000 Units Intravenous Once  . insulin aspart  0-15 Units Subcutaneous TID WC  . losartan  25 mg Oral QPM  . metoprolol tartrate  12.5 mg Oral BID  . sodium chloride flush  3 mL Intravenous Q12H  . ticagrelor  90 mg Oral BID   Continuous Infusions:  PRN Meds: sodium chloride, acetaminophen, hydrALAZINE, ondansetron (ZOFRAN) IV, sodium chloride flush   Vital Signs    Vitals:   02/16/17 0752 02/16/17 0800 02/16/17 0900 02/16/17 1000  BP:  139/86 (!) 178/90 136/85  Pulse:  75 75 75  Resp:  15 (!) 22 15  Temp: 98 F (36.7 C)     TempSrc: Oral     SpO2:  96% 100% 99%  Weight:      Height:        Intake/Output Summary (Last 24 hours) at 02/16/17 1027 Last data filed at 02/15/17 2100  Gross per 24 hour  Intake           945.72 ml  Output             3150 ml  Net         -2204.28 ml   Filed Weights   02/15/17 1500  Weight: 288 lb 12.8 oz (131 kg)    Telemetry    NSR, short runs of NSVT - Personally Reviewed  ECG    NSR, inferior Q waves - Personally Reviewed  Physical Exam   GEN: No acute distress.   Neck: No JVD Cardiac: RRR, no murmurs, rubs, or gallops.  Respiratory: Clear to auscultation bilaterally. GI: Soft, nontender, non-distended  MS: No edema; No deformity. No wrist hematoma Neuro:  Nonfocal  Psych: Normal affect   Labs    Chemistry Recent Labs Lab 02/15/17 1330 02/15/17 2114 02/16/17 0329  NA 135  --  141  K 4.1  --  3.7  CL 101  --  108  CO2 25  --  24  GLUCOSE 207* 136* 180*  BUN 10  --  8  CREATININE 0.77  --  0.59*  CALCIUM 8.9   --  8.7*  GFRNONAA >60  --  >60  GFRAA >60  --  >60  ANIONGAP 9  --  9     Hematology Recent Labs Lab 02/15/17 1330 02/16/17 0329  WBC 10.9* 9.4  RBC 5.02 4.81  HGB 14.6 13.9  HCT 43.8 42.0  MCV 87.3 87.3  MCH 29.1 28.9  MCHC 33.3 33.1  RDW 13.5 13.9  PLT 213 205    Cardiac EnzymesNo results for input(s): TROPONINI in the last 168 hours.  Recent Labs Lab 02/15/17 1342  TROPIPOC 0.00     BNPNo results for input(s): BNP, PROBNP in the last 168 hours.   DDimer No results for input(s): DDIMER in the last 168 hours.   Radiology    No results found.  Cardiac Studies   Cath results: DES to RCA  Patient Profile     56 y.o. male with inferior MI yesterday and DM  Assessment &  Plan    Cardiac: COntine DAPT and aggressive secondary prevention. Enzymes trending down.  EF only slightly decreased by vgram.  DM: OK to dose his insulin.  Await A1C results.   HTN: Started valsartan.  Renal function stable.  Will have to uptitrate as needed to keep the BP <130/80.  Hyperlipidemia: COntinue statin. LDL target 70.  Signed, Larae Grooms, MD  02/16/2017, 10:27 AM

## 2017-02-17 ENCOUNTER — Encounter (HOSPITAL_COMMUNITY): Payer: Self-pay | Admitting: *Deleted

## 2017-02-17 ENCOUNTER — Telehealth: Payer: Self-pay | Admitting: Interventional Cardiology

## 2017-02-17 DIAGNOSIS — Z8679 Personal history of other diseases of the circulatory system: Secondary | ICD-10-CM

## 2017-02-17 LAB — GLUCOSE, CAPILLARY
GLUCOSE-CAPILLARY: 153 mg/dL — AB (ref 65–99)
Glucose-Capillary: 143 mg/dL — ABNORMAL HIGH (ref 65–99)
Glucose-Capillary: 178 mg/dL — ABNORMAL HIGH (ref 65–99)

## 2017-02-17 LAB — HEMOGLOBIN A1C
HEMOGLOBIN A1C: 6.9 % — AB (ref 4.8–5.6)
MEAN PLASMA GLUCOSE: 151 mg/dL

## 2017-02-17 MED ORDER — NITROGLYCERIN 0.4 MG SL SUBL: 0.4000 mg | SUBLINGUAL_TABLET | SUBLINGUAL | Status: DC | PRN

## 2017-02-17 MED ORDER — METOPROLOL TARTRATE 25 MG PO TABS: 12.5000 mg | ORAL_TABLET | Freq: Two times a day (BID) | ORAL | 3 refills | Status: DC

## 2017-02-17 MED ORDER — ATORVASTATIN CALCIUM 80 MG PO TABS: 80.0000 mg | ORAL_TABLET | Freq: Every day | ORAL | 3 refills | Status: DC

## 2017-02-17 MED ORDER — ACETAMINOPHEN 325 MG PO TABS: 650.0000 mg | ORAL_TABLET | ORAL | Status: AC | PRN

## 2017-02-17 MED ORDER — LOSARTAN POTASSIUM 50 MG PO TABS: 50.0000 mg | ORAL_TABLET | Freq: Every day | ORAL | 3 refills | Status: DC

## 2017-02-17 MED ORDER — TICAGRELOR 90 MG PO TABS: 90.0000 mg | ORAL_TABLET | Freq: Two times a day (BID) | ORAL | 3 refills | Status: DC

## 2017-02-17 MED ORDER — ASPIRIN 81 MG PO CHEW: 81.0000 mg | CHEWABLE_TABLET | Freq: Every day | ORAL | Status: DC

## 2017-02-17 NOTE — Discharge Instructions (Signed)
Heart Attack A heart attack (myocardial infarction, MI) causes damage to your heart that cannot be fixed. A heart attack can happen when a heart (coronary) artery becomes blocked or narrowed. This cuts off the blood supply and oxygen to your heart. When one or more of your coronary arteries becomes blocked, that area of your heart begins to die. This causes the pain you feel during a heart attack. Heart attack pain can also occur in one part of the body but be felt in another part of the body (referred pain). You may feel referred heart attack pain in your left arm, neck, or jaw. Pain may even be felt in the right arm. What are the causes? Many conditions can cause a heart attack. These include:  Atherosclerosis. This is when a fatty substance (plaque) gradually builds up in the arteries. This buildup can block or reduce the blood supply to one or more of the heart arteries.  A blood clot. A blood clot can develop suddenly when plaque breaks up (ruptures) within a heart artery. A blood clot can block the heart artery, which prevents blood flow to the heart.  Severe tightening (spasm) of the heart artery. This cuts off blood flow through the artery. What increases the risk?   People at risk for heart attack usually have one or more of the following risk factors:  High blood pressure (hypertension).  High cholesterol.  Smoking.  Being male.  Being overweight or obese.  Older aged.  A family history of heart disease.  Lack of exercise.  Diabetes.  Stress.  Drinking too much alcohol.  Using illegal street drugs, such as cocaine and methamphetamines. What are the signs or symptoms? Heart attack symptoms can vary from person to person. Symptoms depend on factors like gender and age.  In both men and women, heart attack symptoms can include the following:  Chest pain. This may feel like crushing, squeezing, or a feeling of pressure.  Shortness of breath.  Heartburn or  indigestion with or without vomiting, shortness of breath, or sweating.  Sudden cold sweats.  Sudden light-headedness.  Upper back pain.  Women can have unique heart attack symptoms, such as:  Unexplained feelings of nervousness or anxiety.  Discomfort between the shoulder blades or upper back.  Tingling in the hands and arms.  Older people (of both genders) can have subtle heart attack symptoms, such as:  Sweating.  Shortness of breath.  General tiredness or not feeling well. How is this diagnosed? Diagnosing a heart attack involves several tests. They include:  An assessment of your vital signs. This includes checking your:  Heart rhythm.  Blood pressure.  Breathing rate.  Oxygen level.  An ECG (electrocardiogram) to measure the electrical activity of your heart.  Blood tests called cardiac markers. In these tests, blood is drawn at scheduled times to check for the specific proteins or enzymes released by damaged heart muscle.  A chest X-ray.  An echocardiogram to evaluate heart motion and blood flow.  Coronary angiography to look at the heart arteries. How is this treated? Treatment for a heart attack may include:  Medicine that breaks up or dissolves blood clots in the heart artery.  Angioplasty.  Cardiac stent placement.  Intra-aortic balloon pump therapy (IABP).  Open heart surgery (coronary artery bypass graft, CABG). Follow these instructions at home:  Take medicines only as directed by your health care provider. You may need to take medicine after a heart attack to:  Keep your blood from clotting  too easily.  Control your blood pressure.  Lower your cholesterol.  Control abnormal heart rhythms.  Do not take the following medicines unless your health care provider approves:  Nonsteroidal anti-inflammatory drugs (NSAIDs), such as ibuprofen, naproxen, or celecoxib.  Vitamin supplements that contain vitamin A, vitamin E, or  both.  Hormone replacement therapy that contains estrogen with or without progestin.  Make lifestyle changes as directed by your health care provider. These may include:  Quitting smoking, if you smoke.  Getting regular exercise. Ask your health care provider to suggest some activities that are safe for you.  Eating a heart-healthy diet. A registered dietitian can help you learn healthy eating options.  Maintaining a healthy weight.  Managing diabetes, if necessary.  Reducing stress.  Limiting how much alcohol you drink. Get help right away if:  You have sudden, unexplained chest discomfort.  You have sudden, unexplained discomfort in your arms, back, neck, or jaw.  You have shortness of breath at any time.  You suddenly start to sweat or your skin gets clammy.  You feel nauseous or vomit.  You suddenly feel light-headed or dizzy.  Your heart begins to beat fast or feels like it is skipping beats. These symptoms may represent a serious problem that is an emergency. Do not wait to see if the symptoms will go away. Get medical help right away. Call your local emergency services (911 in the [place] Do not drive yourself to the hospital. This information is not intended to replace advice given to you by your health care provider. Make sure you discuss any questions you have with your health care provider. Document Released: 11/17/2005 Document Revised: 04/24/2016 Document Reviewed: 01/20/2014 Elsevier Interactive Patient Education  2017 [place]. Coronary Angiogram With Stent, Care After This sheet gives you information about how to care for yourself after your procedure. Your health care provider may also give you more specific instructions. If you have problems or questions, contact your health care provider. What can I expect after the procedure? After your procedure, it is common to have:  Bruising in the area where a small, thin tube (catheter) was inserted. This  usually fades within 1-2 weeks.  Blood collecting in the tissue (hematoma) that may be painful to the touch. It should usually decrease in size and tenderness within 1-2 weeks. Follow these instructions at home: Insertion area care   Do not take baths, swim, or use a hot tub until your health care provider approves.  You may shower 24-48 hours after the procedure or as directed by your health care provider.  Follow instructions from your health care provider about how to take care of your incision. Make sure you:  Wash your hands with soap and water before you change your bandage (dressing). If soap and water are not available, use hand sanitizer.  Change your dressing as told by your health care provider.  Leave stitches (sutures), skin glue, or adhesive strips in place. These skin closures may need to stay in place for 2 weeks or longer. If adhesive strip edges start to loosen and curl up, you may trim the loose edges. Do not remove adhesive strips completely unless your health care provider tells you to do that.  Remove the bandage (dressing) and gently wash the catheter insertion site with plain soap and water.  Pat the area dry with a clean towel. Do not rub the area, because that may cause bleeding.  Do not apply powder or lotion to the incision area.  Check your incision area every day for signs of infection. Check for:  More redness, swelling, or pain.  More fluid or blood.  Warmth.  Pus or a bad smell. Activity   Do not drive for 24 hours if you were given a medicine to help you relax (sedative).  Do not lift anything that is heavier than 10 lb (4.5 kg) for 5 days after your procedure or as directed by your health care provider.  Ask your health care provider when it is okay for you:  To return to work or school.  To resume usual physical activities or sports.  To resume sexual activity. Eating and drinking   Eat a heart-healthy diet. This should include  plenty of fresh fruits and vegetables.  Avoid the following types of food:  Food that is high in salt.  Canned or highly processed food.  Food that is high in saturated fat or sugar.  Fried food.  Limit alcohol intake to no more than 1 drink a day for non-pregnant women and 2 drinks a day for men. One drink equals 12 oz of beer, 5 oz of wine, or 1 oz of hard liquor. Lifestyle   Do not use any products that contain nicotine or tobacco, such as cigarettes and e-cigarettes. If you need help quitting, ask your health care provider.  Take steps to manage and control your weight.  Get regular exercise.  Manage your blood pressure.  Manage other health problems, such as diabetes. General instructions   Take over-the-counter and prescription medicines only as told by your health care provider. Blood thinners may be prescribed after your procedure to improve blood flow through the stent.  If you need an MRI after your heart stent has been placed, be sure to tell the health care provider who orders the MRI that you have a heart stent.  Keep all follow-up visits as directed by your health care provider. This is important. Contact a health care provider if:  You have a fever.  You have chills.  You have increased bleeding from the catheter insertion area. Hold pressure on the area. Get help right away if:  You develop chest pain or shortness of breath.  You feel faint or you pass out.  You have unusual pain at the catheter insertion area.  You have redness, warmth, or swelling at the catheter insertion area.  You have drainage (other than a small amount of blood on the dressing) from the catheter insertion area.  The catheter insertion area is bleeding, and the bleeding does not stop after 30 minutes of holding steady pressure on the area.  You develop bleeding from any other place, such as from your rectum. There may be bright red blood in your urine or stool, or it may  appear as black, tarry stool. This information is not intended to replace advice given to you by your health care provider. Make sure you discuss any questions you have with your health care provider. Document Released: 06/06/2005 Document Revised: 08/14/2016 Document Reviewed: 08/14/2016 Elsevier Interactive Patient Education  2017 [place] Refer to this sheet in the next few weeks. These instructions provide you with information about caring for yourself after your procedure. Your health care provider may also give you more specific instructions. Your treatment has been planned according to current medical practices, but problems sometimes occur. Call your health care provider if you have any problems or questions after your procedure. What can I expect  after the procedure? After your procedure, it is typical to have the following:  Bruising at the radial site that usually fades within 1-2 weeks.  Blood collecting in the tissue (hematoma) that may be painful to the touch. It should usually decrease in size and tenderness within 1-2 weeks. Follow these instructions at home:  Take medicines only as directed by your health care provider.  You may shower 24-48 hours after the procedure or as directed by your health care provider. Remove the bandage (dressing) and gently wash the site with plain soap and water. Pat the area dry with a clean towel. Do not rub the site, because this may cause bleeding.  Do not take baths, swim, or use a hot tub until your health care provider approves.  Check your insertion site every day for redness, swelling, or drainage.  Do not apply powder or lotion to the site.  Do not flex or bend the affected arm for 24 hours or as directed by your health care provider.  Do not push or pull heavy objects with the affected arm for 24 hours or as directed by your health care provider.  Do not lift over 10 lb (4.5 kg) for 5 days after your  procedure or as directed by your health care provider.  Ask your health care provider when it is okay to:  Return to work or school.  Resume usual physical activities or sports.  Resume sexual activity.  Do not drive home if you are discharged the same day as the procedure. Have someone else drive you.  You may drive 24 hours after the procedure unless otherwise instructed by your health care provider.  Do not operate machinery or power tools for 24 hours after the procedure.  If your procedure was done as an outpatient procedure, which means that you went home the same day as your procedure, a responsible adult should be with you for the first 24 hours after you arrive home.  Keep all follow-up visits as directed by your health care provider. This is important. Contact a health care provider if:  You have a fever.  You have chills.  You have increased bleeding from the radial site. Hold pressure on the site. Get help right away if:  You have unusual pain at the radial site.  You have redness, warmth, or swelling at the radial site.  You have drainage (other than a small amount of blood on the dressing) from the radial site.  The radial site is bleeding, and the bleeding does not stop after 30 minutes of holding steady pressure on the site.  Your arm or hand becomes pale, cool, tingly, or numb. This information is not intended to replace advice given to you by your health care provider. Make sure you discuss any questions you have with your health care provider. Document Released: 12/20/2010 Document Revised: 04/24/2016 Document Reviewed: 06/05/2014 Elsevier Interactive Patient Education  2017 [employer].

## 2017-02-17 NOTE — Progress Notes (Signed)
Progress Note  Patient Name: Derek Herrera Date of Encounter: 02/17/2017  Primary Cardiologist: Allred  Subjective   No chest/arm pain.  Feels well.  Wants to go home. Concerned about the cost of meds as he is uninsured.  He prefers generics.  THis has influenced the type of insulin he uses.   Inpatient Medications    Scheduled Meds: . aspirin  81 mg Oral Daily  . atorvastatin  80 mg Oral q1800  . heparin  4,000 Units Intravenous Once  . insulin aspart  0-20 Units Subcutaneous TID WC  . insulin aspart  0-5 Units Subcutaneous QHS  . insulin aspart  8 Units Subcutaneous TID WC  . losartan  25 mg Oral QPM  . metoprolol tartrate  12.5 mg Oral BID  . sodium chloride flush  3 mL Intravenous Q12H  . ticagrelor  90 mg Oral BID   Continuous Infusions:  PRN Meds: sodium chloride, acetaminophen, hydrALAZINE, ondansetron (ZOFRAN) IV, sodium chloride flush   Vital Signs    Vitals:   02/16/17 2100 02/17/17 0611 02/17/17 0850 02/17/17 0905  BP: 121/87 124/81 (!) 144/86   Pulse: 88   91  Resp:      Temp:      TempSrc:      SpO2:  100%    Weight:      Height:        Intake/Output Summary (Last 24 hours) at 02/17/17 1015 Last data filed at 02/17/17 0908  Gross per 24 hour  Intake              840 ml  Output                0 ml  Net              840 ml   Filed Weights   02/15/17 1500  Weight: 288 lb 12.8 oz (131 kg)    Telemetry    NSR, since being on 3W- Personally Reviewed  ECG    NSR, inferior Q waves - Personally Reviewed  Physical Exam   GEN: No acute distress.   Neck: No JVD Cardiac: RRR, no murmurs, rubs, or gallops.  Respiratory: Clear to auscultation bilaterally. GI: Soft, nontender, non-distended  MS: No edema; No deformity. No wrist hematoma Neuro:  Nonfocal  Psych: Normal affect   Labs    Chemistry  Recent Labs Lab 02/15/17 1330 02/15/17 1403 02/15/17 2114 02/16/17 0329  NA 135 139  --  141  K 4.1 3.7  --  3.7  CL 101 103  --  108    CO2 25  --   --  24  GLUCOSE 207* 201* 136* 180*  BUN 10 10  --  8  CREATININE 0.77 0.50*  --  0.59*  CALCIUM 8.9  --   --  8.7*  GFRNONAA >60  --   --  >60  GFRAA >60  --   --  >60  ANIONGAP 9  --   --  9     Hematology  Recent Labs Lab 02/15/17 1330 02/15/17 1403 02/16/17 0329  WBC 10.9*  --  9.4  RBC 5.02  --  4.81  HGB 14.6 13.9 13.9  HCT 43.8 41.0 42.0  MCV 87.3  --  87.3  MCH 29.1  --  28.9  MCHC 33.3  --  33.1  RDW 13.5  --  13.9  PLT 213  --  205    Cardiac EnzymesNo results for input(s): TROPONINI  in the last 168 hours.   Recent Labs Lab 02/15/17 1342  TROPIPOC 0.00     BNPNo results for input(s): BNP, PROBNP in the last 168 hours.   DDimer No results for input(s): DDIMER in the last 168 hours.   Radiology    No results found.  Cardiac Studies   Cath results: DES to RCA  Patient Profile     55 y.o. 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 Enzymes trending down.  EF only slightly decreased by vgram.  He will go home on Brilinta with the 30 day free card.  He will need a copay card as well.  Will need to have the case manager come back and see him.  He will see what the cost of generic plavix is at the pharmacy vs. Briinta with the copay card and decide on therapy.  I explained to him that he will need statin, DAPT, metoprolol and ARB.  DM: OK to dose his insulin.  A1C 6.9.  He can continue on his regular insulin therapy.   HTN: Started valsartan.  Will increase losartan to 50 mg daily due to increased BP readings.   Renal function stable.  Will have to uptitrate as needed to keep the BP <130/80.  Hyperlipidemia: COntinue statin. LDL target 70.  Signed, Larae Grooms, MD  02/17/2017, 10:15 AM

## 2017-02-17 NOTE — Telephone Encounter (Signed)
New message      TCM appt on 02-25-17 with Mare Loan per Kerin Ransom.

## 2017-02-17 NOTE — Plan of Care (Signed)
Problem: Education: Goal: Knowledge of [facility] Education information/materials will improve Outcome: Completed/Met Date Met: 02/17/17 Pt as well as wife educated on infection prevention, diet, f/u appmts, meds, exercise, restrictions & pain scale. Pt as well as wife educated on the s/s of a heart attack & when to call 911.  Problem: Health Behavior/Discharge Planning: Goal: Ability to manage health-related needs will improve Outcome: Completed/Met Date Met: 02/17/17 Pt's discharge needs were addressed. Pt as well as wife educated on meds, f./u apptmts, site care & etc. Pt's prescriptions for 3 meds were sent to [place] the pt was given a paper prescription for his brilinta along with the card. Pt's friend who is a Clinical biochemist came to pray with pt & wife prior to discharge.   Problem: Physical Regulation: Goal: Ability to maintain clinical measurements within normal limits will improve Outcome: Completed/Met Date Met: 02/17/17 Pt has not been in any pain this shift & did not complain of any pain at discharge.  Problem: Skin Integrity: Goal: Risk for impaired skin integrity will decrease Outcome: Completed/Met Date Met: 02/17/17 Pt's braden scale was > 18.   Problem: Tissue Perfusion: Goal: Risk factors for ineffective tissue perfusion will decrease Outcome: Completed/Met Date Met: 02/17/17 Pt going home on Brilinta  Problem: Activity: Goal: Risk for activity intolerance will decrease Outcome: Completed/Met Date Met: 02/17/17 Pt was able to walk unit without any distress  Problem: Fluid Volume: Goal: Ability to maintain a balanced intake and output will improve Outcome: Completed/Met Date Met: 02/17/17 Pt & wife educated on the importance of a heart healthly & carb modified diet.  Problem: Cardiac: Goal: Vascular access site(s) Level 0-1 will be maintained Pt & wife educated on s/s of a MI. teachback method was used.   Problem: Education: Goal: Understanding of  cardiac disease, CV risk reduction, and recovery process will improve Outcome: Completed/Met Date Met: 02/17/17 Vascular site was a level O. Both pt & wife educated on what to check for on a daily basis & the restrictions for the site.

## 2017-02-17 NOTE — Care Management Note (Signed)
Case Management Note  Patient Details  Name: Derek Herrera MRN: [id number] Date of Birth: 1961/10/26  Subjective/Objective:  Pt presented for [facility]. Pt has PCP. Plan for dc home on Brilinta. CM did call the [place] and Plavix cost will be $9.00 for month supply. MD/ patient decided on Brilinta. CM will provide pt with the 30 day free card and patient assistance application. Pt will have application faxed to company via MD office.                 Action/Plan: Pt will go to [facility] on [address] to pick up medication Brilinta. Per pt he has a job and will be able to afford other medications due to reasonable cost. No further needs from CM at this time.   Expected Discharge Date:                  Expected Discharge Plan:  Home/Self Care  In-House Referral:  NA  Discharge planning Services  CM Consult, Medication Assistance  Post Acute Care Choice:  NA Choice offered to:  NA  DME Arranged:  N/A DME Agency:  NA  HH Arranged:  NA HH Agency:  NA  Status of Service:  Completed, signed off  If discussed at [place] of Stay Meetings, dates discussed:    Additional Comments:  Bethena Roys, RN 02/17/2017, 10:49 AM

## 2017-02-17 NOTE — Progress Notes (Signed)
Pt walking independently. No c/o. Discussed options for Brilinta assistance. He is wanting to apply. Reviewed ed and CRPII.  [id number] Yves Dill CES, ACSM 10:25 AM 02/17/2017

## 2017-02-17 NOTE — Discharge Summary (Signed)
Discharge Summary    Patient ID: Derek Herrera,  MRN: [id number], DOB/AGE: 04-10-1961 56 y.o.  Admit date: 02/15/2017 Discharge date: 02/17/2017  Primary Care Provider: No primary care provider on file. Primary Cardiologist: Dr Irish Lack  Discharge Diagnoses    Principal Problem:   ST elevation myocardial infarction involving right coronary artery [facility]) Active Problems:   Essential hypertension   History of atrial fibrillation   Type 2 diabetes mellitus with complication, with long-term current use of insulin (HCC)   Mixed hyperlipidemia   Allergies No Known Allergies  Diagnostic Studies/Procedures    Cardiac cath/ RCA PCI with DES 02/15/17 _____________   History of Present Illness     56 y/o adm with inferior STEMI  Hospital Course     56 y/o who has had DM for nearly 20 years.  He has a h/o atrial flutter and was cardioverted several years ago.  He took blood thinners for a time after his cardioversion but stopped on his own. He has not been seen by cardiology in some time.  He presented to the ED 02/15/17 with tightness in his chest and under both arms.  ECG showed marked ST elevation inferiorly with lateral ST depression. He was taken to the cath lab by Dr Irish Lack. Cath revealed a 99% pRCA stenosis treated with a DES. His EF was 45%. There was mild Lt system disease. He was sent to the floor and ambulated. He was seen by Care Manager and Diabetic Counselor. He has no insurance and cost of medications will be an issue. He was provided Brilinta samples. He'll need a TOC f/u in 7-14 days.  _____________  Discharge Vitals Blood pressure (!) 144/86, pulse 91, temperature 97.6 F (36.4 C), temperature source Oral, resp. rate 18, height 6\' 4"  (1.93 m), weight 288 lb 12.8 oz (131 kg), SpO2 100 %.  Filed Weights   02/15/17 1500  Weight: 288 lb 12.8 oz (131 kg)    Labs & Radiologic Studies    CBC  Recent Labs  02/15/17 1330 02/15/17 1403 02/16/17 0329  WBC  10.9*  --  9.4  NEUTROABS 8.7*  --   --   HGB 14.6 13.9 13.9  HCT 43.8 41.0 42.0  MCV 87.3  --  87.3  PLT 213  --  341   Basic Metabolic Panel  Recent Labs  02/15/17 1330 02/15/17 1403 02/15/17 2114 02/16/17 0329  NA 135 139  --  141  K 4.1 3.7  --  3.7  CL 101 103  --  108  CO2 25  --   --  24  GLUCOSE 207* 201* 136* 180*  BUN 10 10  --  8  CREATININE 0.77 0.50*  --  0.59*  CALCIUM 8.9  --   --  8.7*   Liver Function Tests No results for input(s): AST, ALT, ALKPHOS, BILITOT, PROT, ALBUMIN in the last 72 hours. No results for input(s): LIPASE, AMYLASE in the last 72 hours. Cardiac Enzymes  Recent Labs  02/15/17 1330 02/15/17 2114 02/16/17 0329  CKTOTAL 162 801* 647*  CKMB 4.3 93.4* 66.6*   BNP Invalid input(s): POCBNP D-Dimer No results for input(s): DDIMER in the last 72 hours. Hemoglobin A1C  Recent Labs  02/16/17 0329  HGBA1C 6.9*   Fasting Lipid Panel  Recent Labs  02/16/17 0329  CHOL 190  HDL 38*  LDLCALC 129*  TRIG 115  CHOLHDL 5.0   Thyroid Function Tests No results for input(s): TSH, T4TOTAL, T3FREE, THYROIDAB in  the last 72 hours.  Invalid input(s): FREET3 _____________  No results found. Disposition   Pt is being discharged home today in good condition.  Follow-up Plans & Appointments    Follow-up Information    Larae Grooms, MD Follow up.   Specialties:  Cardiology, Radiology, Interventional Cardiology Why:  office will contact you Contact information: [address] [place] [address] [phone]          Discharge Instructions    Amb Referral to Cardiac Rehabilitation    Complete by:  As directed    Diagnosis:   Coronary Stents STEMI PTCA        Discharge Medications   Current Discharge Medication List    START taking these medications   Details  acetaminophen (TYLENOL) 325 MG tablet Take 2 tablets (650 mg total) by mouth every 4 (four) hours as needed for headache or mild pain.     aspirin 81 MG chewable tablet Chew 1 tablet (81 mg total) by mouth daily.    atorvastatin (LIPITOR) 80 MG tablet Take 1 tablet (80 mg total) by mouth daily at 6 PM. Qty: 90 tablet, Refills: 3    metoprolol tartrate (LOPRESSOR) 25 MG tablet Take 0.5 tablets (12.5 mg total) by mouth 2 (two) times daily. Qty: 90 tablet, Refills: 3    ticagrelor (BRILINTA) 90 MG TABS tablet Take 1 tablet (90 mg total) by mouth 2 (two) times daily. Qty: 180 tablet, Refills: 3      CONTINUE these medications which have CHANGED   Details  losartan (COZAAR) 50 MG tablet Take 1 tablet (50 mg total) by mouth daily. Qty: 90 tablet, Refills: 3      CONTINUE these medications which have NOT CHANGED   Details  insulin aspart (NOVOLOG) 100 UNIT/ML injection Inject 45-95 Units into the skin 2 (two) times daily before a meal. Take 45 units with breakfast, and 95 units with the evening meal      STOP taking these medications     insulin glargine (LANTUS) 100 UNIT/ML injection          Aspirin prescribed at discharge?  Yes High Intensity Statin Prescribed? (Lipitor 40-80mg  or Crestor 20-40mg ): Yes Beta Blocker Prescribed? Yes For EF <40%, was ACEI/ARB Prescribed? Yes ADP Receptor Inhibitor Prescribed? (i.e. Plavix etc.-Includes Medically Managed Patients): Yes For EF <40%, Aldosterone Inhibitor Prescribed? No: NA Was EF assessed during THIS hospitalization? Yes Was Cardiac Rehab II ordered? (Included Medically managed Patients): Yes   Outstanding Labs/Studies     Duration of Discharge Encounter   Greater than 30 minutes including physician time.  Angelena Form PA 02/17/2017, 11:39 AM   I have examined the patient and reviewed assessment and plan and discussed with patient.  Agree with above as stated.    Cardiac: COntine DAPT and aggressive secondary prevention. Enzymes trending down.  EF only slightly decreased by vgram.  He will go home on Brilinta with the 30 day free card.  He will  need a copay card as well.  Will need to have the case manager come back and see him.  He will see what the cost of generic plavix is at the pharmacy vs. Briinta with the copay card and decide on therapy.  I explained to him that he will need statin, DAPT, metoprolol and ARB.  DM: OK to dose his insulin.  A1C 6.9.  He can continue on his current home insulin therapy.   HTN: Started valsartan in the hosptial.  Will increase losartan  to 50 mg daily due to increased BP readings.   Renal function stable.  Will have to uptitrate as needed to keep the BP <130/80.  Hyperlipidemia: COntinue statin. LDL target 70.  He will need BMet in 1 week.    Signed, Larae Grooms, MD  02/17/2017,

## 2017-02-17 NOTE — Progress Notes (Signed)
Inpatient Diabetes Program Recommendations  AACE/ADA: New Consensus Statement on Inpatient Glycemic Control (2015)  Target Ranges:  Prepandial:   less than 140 mg/dL      Peak postprandial:   less than 180 mg/dL (1-2 hours)      Critically ill patients:  140 - 180 mg/dL   Lab Results  Component Value Date   GLUCAP 153 (H) 02/17/2017   HGBA1C 6.9 (H) 02/16/2017    Review of Glycemic Control  Spoke with pt regarding his diabetes regimen at home. Pt lost insurance several years ago and uses only Novolin R 15-20 units tidwc at home. Checks blood sugars 7-8 times/day. HgbA1C - 6.9%. 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 Stressed importance of f/u with a PCP to manage his DM. Pt states " I manage my diabetes better than my endo did."  Thank you. Lorenda Peck, RD, LDN, CDE Inpatient Diabetes Coordinator [phone]

## 2017-02-18 NOTE — Telephone Encounter (Signed)
Left pt a message to call back. 

## 2017-02-19 NOTE — Telephone Encounter (Signed)
Patient contacted regarding discharge from [facility] on .02/17/17   Patient understands to follow up with Lyda Jester, APP on 02/25/17 at 11:30 am at [place]. Patient understands discharge instructions? Yes Patient understands medications and regiment? Yes Patient understands to bring all medications to this visit? Yes  Pt has no concerns with cath site/has all needed medications

## 2017-02-25 ENCOUNTER — Encounter: Payer: Self-pay | Admitting: Cardiology

## 2017-02-25 ENCOUNTER — Ambulatory Visit (INDEPENDENT_AMBULATORY_CARE_PROVIDER_SITE_OTHER): Payer: Self-pay | Admitting: Cardiology

## 2017-02-25 VITALS — BP 124/80 | HR 70 | Ht 76.0 in | Wt 272.6 lb

## 2017-02-25 DIAGNOSIS — Z9861 Coronary angioplasty status: Secondary | ICD-10-CM

## 2017-02-25 DIAGNOSIS — Z79899 Other long term (current) drug therapy: Secondary | ICD-10-CM

## 2017-02-25 DIAGNOSIS — I2510 Atherosclerotic heart disease of native coronary artery without angina pectoris: Secondary | ICD-10-CM

## 2017-02-25 MED ORDER — NITROGLYCERIN 0.4 MG SL SUBL: 0.4000 mg | SUBLINGUAL_TABLET | SUBLINGUAL | 3 refills | Status: DC | PRN

## 2017-02-25 NOTE — Progress Notes (Signed)
02/25/2017 Derek Herrera   [birth year]  [id number]  Primary Physician Lisabeth Pick, MD Primary Cardiologist: Dr. Irish Lack    Reason for Visit/CC: [facility] F/u for CAD s/p STEMI  HPI:  Derek Herrera presents to clinic today for post hospital f/u. He has a h/o DM and prior h/o atrial flutter years ago but has not been followed by a cardiologist for several years.   He recently presented to [facility] with complaint of chest pain on 02/15/17. ECG showed marked ST elevation inferiorly with lateral ST depression. He was taken to the cath lab by Dr Irish Lack. Cath revealed a 99% pRCA stenosis treated with a DES. His EF was 45%. There was mild Lt system disease. He was placed on DAPT with ASA and Brilinta, high dose statin, BB and ARB. Lipid panel showed an LDL of 129 mg/dL. Hgb A1c was at goal at 6.9.   Today in f/u, he reports that he has done well. He denies any recurrent CP. No dyspnea. He has been doing short 15 min walks at home and he has done well w/o any exertional symptoms. His radial cath site is well healed w/o complications. He reports full medication compliance. He is tolerating meds well w/o side effects. BP is well controlled at 124/80. EKG shows NSR. HR is 70 bpm.     Current Meds  Medication Sig  . acetaminophen (TYLENOL) 325 MG tablet Take 2 tablets (650 mg total) by mouth every 4 (four) hours as needed for headache or mild pain.  Marland Kitchen aspirin 81 MG chewable tablet Chew 1 tablet (81 mg total) by mouth daily.  Marland Kitchen atorvastatin (LIPITOR) 80 MG tablet Take 1 tablet (80 mg total) by mouth daily at 6 PM.  . insulin aspart (NOVOLOG) 100 UNIT/ML injection Inject 45-95 Units into the skin 2 (two) times daily before a meal. Take 45 units with breakfast, and 95 units with the evening meal  . losartan (COZAAR) 50 MG tablet Take 1 tablet (50 mg total) by mouth daily.  . metoprolol tartrate (LOPRESSOR) 25 MG tablet Take 0.5 tablets (12.5 mg total) by mouth 2 (two) times daily.  . nitroGLYCERIN  (NITROSTAT) 0.4 MG SL tablet Place 0.4 mg under the tongue every 5 (five) minutes as needed for chest pain.  . ticagrelor (BRILINTA) 90 MG TABS tablet Take 1 tablet (90 mg total) by mouth 2 (two) times daily.   No Known Allergies Past Medical History:  Diagnosis Date  . Anemia   . Clotting disorder ([place])   . DIABETES MELLITUS, TYPE I 06/29/2007  . DM nephropathy/sclerosis   . HYPERCHOLESTEROLEMIA 05/20/2010  . HYPERTENSION 06/29/2007  . Lytic lesion of bone on x-ray 09/29/2012   Expansile lesion Left lateral rib on CT 09/23/12  Chest otherwise negative  . Obstructive sleep apnea    moderate by sleep study 11/13  . Persistent atrial fibrillation (HCC)    Family History  Problem Relation Age of Onset  . Diabetes Maternal Grandmother   . Bone cancer Maternal Grandmother   . Cancer Paternal Grandfather    Past Surgical History:  Procedure Laterality Date  . CARDIOVERSION  09/03/2012   Procedure: CARDIOVERSION;  Surgeon: Fay Records, MD;  Location: [facility] ENDOSCOPY;  Service: Cardiovascular;  Laterality: N/A;  left message with leb RN to clarify if mcalhaney is doing this procedure/dl/10 2  . CORONARY/GRAFT ACUTE MI REVASCULARIZATION N/A 02/15/2017   Procedure: Coronary/Graft Acute MI Revascularization;  Surgeon: Jettie Booze, MD;  Location: [place] CV LAB;  Service: Cardiovascular;  Laterality: N/A;  . KNEE SURGERY     x's 3  . LEFT HEART CATH AND CORONARY ANGIOGRAPHY N/A 02/15/2017   Procedure: Left Heart Cath and Coronary Angiography;  Surgeon: Jettie Booze, MD;  Location: [place] CV LAB;  Service: Cardiovascular;  Laterality: N/A;  . TEE WITH CARDIOVERSION  08/02/2011   EF of approximately 50-55% conversion to sinus rhythm.  There were no apparent complications  . WRIST FRACTURE SURGERY  right   Social History   Social History  . Marital status: Married    Spouse name: N/A  . Number of children: 5  . Years of education: N/A   Occupational History  . 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 Smoking status: Former Smoker    Packs/day: 1.00    Years: 20.00    Types: Cigarettes    Quit date: 12/01/1990  . Smokeless tobacco: Former Systems developer    Types: Chew, [place]: smokked off an on x 20 yrs 09/20/12  . Alcohol use No  . Drug use: No  . Sexual activity: Not Currently   Other Topics Concern  . Not on file   Social History Narrative  . No narrative on file     Review of Systems: General: negative for chills, fever, night sweats or weight changes.  Cardiovascular: negative for chest pain, dyspnea on exertion, edema, orthopnea, palpitations, paroxysmal nocturnal dyspnea or shortness of breath Dermatological: negative for rash Respiratory: negative for cough or wheezing Urologic: negative for hematuria Abdominal: negative for nausea, vomiting, diarrhea, bright red blood per rectum, melena, or hematemesis Neurologic: negative for visual changes, syncope, or dizziness All other systems reviewed and are otherwise negative except as noted above.   Physical Exam:  Blood pressure 124/80, pulse 70, height 6\' 4"  (1.93 m), weight 272 lb 9.6 oz (123.7 kg).  General appearance: alert, cooperative and no distress Neck: no carotid bruit and no JVD Lungs: clear to auscultation bilaterally Heart: regular rate and rhythm, S1, S2 normal, no murmur, click, rub or gallop Extremities: extremities normal, atraumatic, no cyanosis or edema Pulses: 2+ and symmetric Skin: Skin color, texture, turgor normal. No rashes or lesions Neurologic: Grossly normal  EKG NSR. 70 bpm.   ASSESSMENT AND PLAN:   1. CAD: s/p STEMI 02/15/17 secondary to occluded proximal RCA, treated with PCI + DES. There was mild Lt system disease. EF 45-50%. He is stable w/o recurrent angina. EKG shows NSR. BP and HR both well controlled. Continue DAPT with ASA + Brilinta, high dose statin, BB and ARB. Will place order for cardiac rehab. Will write Rx  for PRN SL NTG.   2. HLD: recent LDL was 129 mg/dL. Goal LDL is <70 mg/dL. Continue high dose statin therapy with Lipitor 80 mg. Recheck FLP and HFTs in 6 weeks.   3. HTN: BP is well controlled on current regimen. We will check a BMP today given he was recently started on losartan.   4. Mild Systolic Dysfunction: EF post STEMI was mildy reduced at 45-50%. Volume is stable. No dyspnea. Continue medical therapy with BB and ARB.  5. Prior H/o Atrial Flutter: per hospital H&P, he has a h/o atrial flutter and was cardioverted several years ago. He took blood thinners for a time after his cardioversion but stopped on his own. He was lost to f/u for a period of time. No reported recurrence of breakthrough symptoms. EKG today shows  NSR. Rate is controlled with metoprolol.   PLAN  Continue current regimen. BMP today to check renal function and K. Repeat FLP and HFTs in 6 weeks. F /u with Dr. Irish Lack in 8 weeks.   Lyda Jester PA-C 02/25/2017 12:17 PM

## 2017-02-25 NOTE — Patient Instructions (Addendum)
Medication Instructions:   Your physician recommends that you continue on your current medications as directed. Please refer to the Current Medication list given to you today.   If you need a refill on your cardiac medications before your next appointment, please call your pharmacy.  Labwork: BMET TODAY    Testing/Procedures: NONE ORDERED  TODAY    Follow-Up: WITH DR Irish Lack IN 8 WEEKS   YOU WILL BE CONTACTED BACK WITH FURTHER STEPS FROM CARDIAC REHAB  Any Other Special Instructions Will Be Listed Below (If Applicable).

## 2017-02-26 LAB — BASIC METABOLIC PANEL
BUN/Creatinine Ratio: 17 (ref 9–20)
BUN: 12 mg/dL (ref 6–24)
CALCIUM: 9.1 mg/dL (ref 8.7–10.2)
CO2: 26 mmol/L (ref 18–29)
CREATININE: 0.72 mg/dL — AB (ref 0.76–1.27)
Chloride: 100 mmol/L (ref 96–106)
GFR, EST AFRICAN AMERICAN: 121 mL/min/{1.73_m2} (ref >59)
GFR, EST NON AFRICAN AMERICAN: 104 mL/min/{1.73_m2} (ref >59)
Glucose: 167 mg/dL — ABNORMAL HIGH (ref 65–99)
Potassium: 4.8 mmol/L (ref 3.5–5.2)
SODIUM: 140 mmol/L (ref 134–144)

## 2017-03-09 ENCOUNTER — Encounter (HOSPITAL_COMMUNITY)
Admission: RE | Admit: 2017-03-09 | Discharge: 2017-03-09 | Disposition: A | Discharge status: Home / Self Care | Payer: Self-pay | Source: Ambulatory Visit | Attending: Interventional Cardiology | Admitting: Interventional Cardiology

## 2017-03-09 DIAGNOSIS — I252 Old myocardial infarction: Secondary | ICD-10-CM | POA: Insufficient documentation

## 2017-03-09 DIAGNOSIS — Z955 Presence of coronary angioplasty implant and graft: Secondary | ICD-10-CM | POA: Insufficient documentation

## 2017-03-10 ENCOUNTER — Encounter (HOSPITAL_COMMUNITY): Payer: Self-pay

## 2017-03-12 ENCOUNTER — Encounter (HOSPITAL_COMMUNITY): Payer: Self-pay

## 2017-03-13 ENCOUNTER — Encounter (HOSPITAL_COMMUNITY)
Admission: RE | Admit: 2017-03-13 | Discharge: 2017-03-13 | Disposition: A | Discharge status: Home / Self Care | Payer: Self-pay | Source: Ambulatory Visit | Attending: Interventional Cardiology | Admitting: Interventional Cardiology

## 2017-03-16 ENCOUNTER — Encounter (HOSPITAL_COMMUNITY)
Admission: RE | Admit: 2017-03-16 | Discharge: 2017-03-16 | Disposition: A | Discharge status: Home / Self Care | Payer: Self-pay | Source: Ambulatory Visit | Attending: Interventional Cardiology | Admitting: Interventional Cardiology

## 2017-03-16 NOTE — Progress Notes (Signed)
Reviewed home exercise guidelines with patient including endpoints, temperature precautions, target heart rate and rate of perceived exertion. Pt is walking 20 minutes 3x's/day, 4-5 days/week at home as his mode of home exercise. Pt voices understanding of instructions given. Pt would like to start resistance training at cardiac rehab, will request OK from cardiologist.  Sol Passer, MS, ACSM CEP

## 2017-03-17 ENCOUNTER — Encounter (HOSPITAL_COMMUNITY)
Admission: RE | Admit: 2017-03-17 | Discharge: 2017-03-17 | Disposition: A | Discharge status: Home / Self Care | Payer: Self-pay | Source: Ambulatory Visit | Attending: Interventional Cardiology | Admitting: Interventional Cardiology

## 2017-03-19 ENCOUNTER — Encounter (HOSPITAL_COMMUNITY)
Admission: RE | Admit: 2017-03-19 | Discharge: 2017-03-19 | Disposition: A | Discharge status: Home / Self Care | Payer: Self-pay | Source: Ambulatory Visit | Attending: Interventional Cardiology | Admitting: Interventional Cardiology

## 2017-03-23 ENCOUNTER — Encounter (HOSPITAL_COMMUNITY)
Admission: RE | Admit: 2017-03-23 | Discharge: 2017-03-23 | Disposition: A | Discharge status: Home / Self Care | Payer: Self-pay | Source: Ambulatory Visit | Attending: Interventional Cardiology | Admitting: Interventional Cardiology

## 2017-03-24 ENCOUNTER — Encounter (HOSPITAL_COMMUNITY): Payer: Self-pay

## 2017-03-25 ENCOUNTER — Encounter (HOSPITAL_COMMUNITY)
Admission: RE | Admit: 2017-03-25 | Discharge: 2017-03-25 | Disposition: A | Discharge status: Home / Self Care | Payer: Self-pay | Source: Ambulatory Visit | Attending: Interventional Cardiology | Admitting: Interventional Cardiology

## 2017-03-26 ENCOUNTER — Encounter (HOSPITAL_COMMUNITY): Payer: Self-pay

## 2017-03-30 ENCOUNTER — Encounter (HOSPITAL_COMMUNITY): Payer: Self-pay

## 2017-03-31 ENCOUNTER — Encounter (HOSPITAL_COMMUNITY): Payer: Self-pay

## 2017-04-02 ENCOUNTER — Encounter (HOSPITAL_COMMUNITY): Payer: Self-pay

## 2017-04-06 ENCOUNTER — Encounter (HOSPITAL_COMMUNITY): Payer: Self-pay

## 2017-04-07 ENCOUNTER — Encounter: Payer: Self-pay | Admitting: Interventional Cardiology

## 2017-04-07 ENCOUNTER — Encounter (HOSPITAL_COMMUNITY): Payer: Self-pay

## 2017-04-09 ENCOUNTER — Encounter (HOSPITAL_COMMUNITY): Payer: Self-pay

## 2017-04-11 ENCOUNTER — Other Ambulatory Visit: Payer: Self-pay | Admitting: Cardiology

## 2017-04-13 ENCOUNTER — Encounter (HOSPITAL_COMMUNITY): Payer: Self-pay

## 2017-04-14 ENCOUNTER — Encounter (HOSPITAL_COMMUNITY): Payer: Self-pay

## 2017-04-16 ENCOUNTER — Encounter (HOSPITAL_COMMUNITY): Payer: Self-pay

## 2017-04-16 ENCOUNTER — Telehealth: Payer: Self-pay | Admitting: *Deleted

## 2017-04-16 NOTE — Telephone Encounter (Signed)
Patient called and requested samples of brilinta. He stated that he has completed the patient assistance application and is bringing it to the office today. He is aware that I will place some samples at the front desk.

## 2017-04-17 ENCOUNTER — Telehealth: Payer: Self-pay

## 2017-04-17 NOTE — Telephone Encounter (Signed)
Application for Brilinta 90mg  faxed to [employer] today.

## 2017-04-20 ENCOUNTER — Encounter (HOSPITAL_COMMUNITY): Payer: Self-pay

## 2017-04-21 ENCOUNTER — Encounter (HOSPITAL_COMMUNITY): Payer: Self-pay

## 2017-04-23 ENCOUNTER — Ambulatory Visit: Payer: Self-pay | Admitting: Interventional Cardiology

## 2017-04-23 ENCOUNTER — Encounter: Payer: Self-pay | Admitting: Interventional Cardiology

## 2017-04-23 ENCOUNTER — Encounter (HOSPITAL_COMMUNITY): Payer: Self-pay

## 2017-04-23 ENCOUNTER — Ambulatory Visit (INDEPENDENT_AMBULATORY_CARE_PROVIDER_SITE_OTHER): Payer: Self-pay | Admitting: Interventional Cardiology

## 2017-04-23 VITALS — BP 106/80 | HR 92 | Ht 76.0 in | Wt 271.1 lb

## 2017-04-23 DIAGNOSIS — I10 Essential (primary) hypertension: Secondary | ICD-10-CM

## 2017-04-23 DIAGNOSIS — I25118 Atherosclerotic heart disease of native coronary artery with other forms of angina pectoris: Secondary | ICD-10-CM

## 2017-04-23 DIAGNOSIS — I252 Old myocardial infarction: Secondary | ICD-10-CM

## 2017-04-23 DIAGNOSIS — E782 Mixed hyperlipidemia: Secondary | ICD-10-CM

## 2017-04-23 MED ORDER — CLOPIDOGREL BISULFATE 75 MG PO TABS: 75.0000 mg | ORAL_TABLET | Freq: Every day | ORAL | 3 refills | Status: DC

## 2017-04-23 NOTE — Patient Instructions (Signed)
Medication Instructions:  Your physician recommends that you continue on your current medications as directed. Please refer to the Current Medication list given to you today.  If you can not get your brilinta let us know. We have a prescription on file for you for plavix.  Labwork: Your physician recommends that you return for labs when you are FASTING: CMET, LIPIDS   Testing/Procedures: None ordered  Follow-Up: Your physician wants you to follow-up in: 6 months with Dr. Irish Lack. You will receive a reminder letter in the mail two months in advance. If you don't receive a letter, please call our office to schedule the follow-up appointment.   Any Other Special Instructions Will Be Listed Below (If Applicable).     If you need a refill on your cardiac medications before your next appointment, please call your pharmacy.

## 2017-04-23 NOTE — Progress Notes (Signed)
Cardiology Office Note   Date:  04/23/2017   ID:  Derek Herrera, Derek Herrera [birth year], MRN [id number]  PCP:  Derek Pick, MD    No chief complaint on file.    Wt Readings from Last 3 Encounters:  04/23/17 271 lb 1.9 oz (123 kg)  02/25/17 272 lb 9.6 oz (123.7 kg)  02/15/17 288 lb 12.8 oz (131 kg)       History of Present Illness: Derek Herrera is a 56 y.o. male  Who has DM.  He an inferior STEMI in 3/18.  He had a DES to the RCA.  He noted some nuisance bleeding with DAPT.    A1C has been well controlled in 3/18 at 6.9.  Denies : Chest pain. Dizziness. Leg edema. Nitroglycerin use. Orthopnea. Palpitations. Paroxysmal nocturnal dyspnea. Shortness of breath. Syncope.   BP has been well controlled.  He walks with his family.   He has been working in the concrete business.    Still without insurance and trying to minimize medicine cost.     Past Medical History:  Diagnosis Date  . Anemia   . Clotting disorder (Derek Herrera)   . DIABETES MELLITUS, TYPE I 06/29/2007  . DM nephropathy/sclerosis   . HYPERCHOLESTEROLEMIA 05/20/2010  . HYPERTENSION 06/29/2007  . Lytic lesion of bone on x-ray 09/29/2012   Expansile lesion Left lateral rib on CT 09/23/12  Chest otherwise negative  . Obstructive sleep apnea    moderate by sleep study 11/13  . Persistent atrial fibrillation Derek Herrera)     Past Surgical History:  Procedure Laterality Date  . CARDIOVERSION  09/03/2012   Procedure: CARDIOVERSION;  Surgeon: Derek Records, MD;  Location: [facility];  Service: Cardiovascular;  Laterality: N/A;  left message with Derek Herrera to clarify if mcalhaney is doing this procedure/dl/10 2  . CORONARY/GRAFT ACUTE MI REVASCULARIZATION N/A 02/15/2017   Procedure: Coronary/Graft Acute MI Revascularization;  Surgeon: Derek Booze, MD;  Location: [facility];  Service: Cardiovascular;  Laterality: N/A;  . KNEE SURGERY     x's 3  . LEFT HEART CATH AND CORONARY ANGIOGRAPHY N/A 02/15/2017   Procedure: Left Heart Cath and Coronary Angiography;  Surgeon: Derek Booze, MD;  Location: [facility];  Service: Cardiovascular;  Laterality: N/A;  . TEE WITH CARDIOVERSION  08/02/2011   EF of approximately 50-55% conversion to sinus rhythm.  There were no apparent complications  . WRIST FRACTURE SURGERY  right     Current Outpatient Prescriptions  Medication Sig Dispense Refill  . acetaminophen (TYLENOL) 325 MG tablet Take 2 tablets (650 mg total) by mouth every 4 (four) hours as needed for headache or mild pain.    Marland Kitchen aspirin 81 MG chewable tablet Chew 1 tablet (81 mg total) by mouth daily.    Marland Kitchen atorvastatin (LIPITOR) 80 MG tablet Take 1 tablet (80 mg total) by mouth daily at 6 PM. 90 tablet 3  . insulin aspart (NOVOLOG) 100 UNIT/ML injection Inject 45 Units into the skin 3 (three) times daily before meals. 45-95 UNITS AS NEEDED    . losartan (COZAAR) 50 MG tablet Take 1 tablet (50 mg total) by mouth daily. 90 tablet 3  . metoprolol tartrate (LOPRESSOR) 25 MG tablet Take 0.5 tablets (12.5 mg total) by mouth 2 (two) times daily. 90 tablet 3  . nitroGLYCERIN (NITROSTAT) 0.4 MG SL tablet Place 1 tablet (0.4 mg total) under the tongue every 5 (five) minutes as needed for chest pain. 25 tablet 3  .  ticagrelor (BRILINTA) 90 MG TABS tablet Take 1 tablet (90 mg total) by mouth 2 (two) times daily. 180 tablet 3   No current facility-administered medications for this visit.     Allergies:   Patient has no known allergies.    Social History:  The patient  reports that he quit smoking about 26 years ago. His smoking use included Cigarettes. He has a 20.00 pack-year smoking history. He has quit using smokeless tobacco. His smokeless tobacco use included Chew and Snuff. He reports that he does not drink alcohol or use drugs.   Family History:  The patient's family history includes Bone cancer in his maternal grandmother; Cancer in his paternal grandfather; Diabetes in his maternal  grandmother.    ROS:  Please see the history of present illness.   Otherwise, review of systems are positive for bruising, no bleeding; maintaining weight loss since the MI.   All other systems are reviewed and negative.    PHYSICAL EXAM: VS:  BP 106/80 (BP Location: Right Arm, Patient Position: Sitting, Cuff Size: Large)   Pulse 92   Ht 6\' 4"  (1.93 m)   Wt 271 lb 1.9 oz (123 kg)   SpO2 98%   BMI 33.00 kg/m  , BMI Body mass index is 33 kg/m. GEN: Well nourished, well developed, in no acute distress ; tall HEENT: normal  Neck: no JVD, carotid bruits, or masses Cardiac: RRR; no murmurs, rubs, or gallops,no edema  Respiratory:  clear to auscultation bilaterally, normal work of breathing GI: soft, nontender, nondistended,  MS: no deformity or atrophy  Skin: warm and dry, no rash; mild bruising Neuro:  Strength and sensation are intact Psych: euthymic mood, full affect     Recent Labs: 02/16/2017: Hemoglobin 13.9; Platelets 205 02/25/2017: BUN 12; Creatinine, Ser 0.72; Potassium 4.8; Sodium 140   Lipid Panel    Component Value Date/Time   CHOL 190 02/16/2017 0329   TRIG 115 02/16/2017 0329   HDL 38 (L) 02/16/2017 0329   CHOLHDL 5.0 02/16/2017 0329   VLDL 23 02/16/2017 0329   LDLCALC 129 (H) 02/16/2017 0329     Other studies Reviewed: Additional studies/ Herrera that were reviewed today with results demonstrating: cath Herrera reviewed, RCA revascularized with mild left sided disease in 3/18.   ASSESSMENT AND PLAN:  1. CAD/Old MI:  Angina controlled on medical therapy.  No heart failure sx.  Getting Brilinta samples.  Will call in clopidogrel as Brilinta will not be affordable long term. 2. DM: COntinue current therapy.  He has not been seeing a PMD regularly, but A1C was controlled at the hospital.  3. Obesity: Weight loss will help also.  COntiue lifestyle changes that are helping him lose weight. 4. Check labs when fasting.  LDL target 70.  5. HTN: BP  controlled.   Current medicines are reviewed at length with the patient today.  The patient concerns regarding his medicines were addressed.  The following changes have been made:  May be changing Brilinta to Plavix if he cannot get Brilinta through patient assistance.  He is still waiting to hear.  Labs/ tests ordered today include:  No orders of the defined types were placed in this encounter.   Recommend 150 minutes/week of aerobic exercise Low fat, low carb, high fiber diet recommended  Disposition:   FU in 6 months   Signed, Larae Grooms, MD  04/23/2017 4:47 PM    [facility] [address], [place], [place]  [address] Phone: [phone])  [phone]; Fax: [phone]

## 2017-04-27 DIAGNOSIS — I252 Old myocardial infarction: Secondary | ICD-10-CM | POA: Insufficient documentation

## 2017-04-27 DIAGNOSIS — I2510 Atherosclerotic heart disease of native coronary artery without angina pectoris: Secondary | ICD-10-CM | POA: Insufficient documentation

## 2017-04-28 ENCOUNTER — Encounter (HOSPITAL_COMMUNITY): Payer: Self-pay

## 2017-04-29 ENCOUNTER — Telehealth: Payer: Self-pay | Admitting: Interventional Cardiology

## 2017-04-29 ENCOUNTER — Other Ambulatory Visit: Payer: Self-pay | Admitting: *Deleted

## 2017-04-29 DIAGNOSIS — E782 Mixed hyperlipidemia: Secondary | ICD-10-CM

## 2017-04-29 DIAGNOSIS — I10 Essential (primary) hypertension: Secondary | ICD-10-CM

## 2017-04-29 LAB — COMPREHENSIVE METABOLIC PANEL
A/G RATIO: 1.7 (ref 1.2–2.2)
ALBUMIN: 4 g/dL (ref 3.5–5.5)
ALT: 21 IU/L (ref 0–44)
AST: 21 IU/L (ref 0–40)
Alkaline Phosphatase: 78 IU/L (ref 39–117)
BUN / CREAT RATIO: 13 (ref 9–20)
BUN: 10 mg/dL (ref 6–24)
Bilirubin Total: 0.7 mg/dL (ref 0.0–1.2)
CALCIUM: 9.3 mg/dL (ref 8.7–10.2)
CO2: 24 mmol/L (ref 18–29)
Chloride: 102 mmol/L (ref 96–106)
Creatinine, Ser: 0.78 mg/dL (ref 0.76–1.27)
GFR, EST AFRICAN AMERICAN: 117 mL/min/{1.73_m2} (ref >59)
GFR, EST NON AFRICAN AMERICAN: 101 mL/min/{1.73_m2} (ref >59)
GLOBULIN, TOTAL: 2.4 g/dL (ref 1.5–4.5)
Glucose: 159 mg/dL — ABNORMAL HIGH (ref 65–99)
POTASSIUM: 4.6 mmol/L (ref 3.5–5.2)
SODIUM: 139 mmol/L (ref 134–144)
TOTAL PROTEIN: 6.4 g/dL (ref 6.0–8.5)

## 2017-04-29 LAB — LIPID PANEL
CHOL/HDL RATIO: 2.8 ratio (ref 0.0–5.0)
Cholesterol, Total: 111 mg/dL (ref 100–199)
HDL: 40 mg/dL (ref >39)
LDL Calculated: 57 mg/dL (ref 0–99)
Triglycerides: 69 mg/dL (ref 0–149)
VLDL Cholesterol Cal: 14 mg/dL (ref 5–40)

## 2017-04-29 NOTE — Telephone Encounter (Signed)
New message ° ° ° °Pt is calling back for lab results. °

## 2017-04-30 ENCOUNTER — Encounter (HOSPITAL_COMMUNITY): Payer: Self-pay

## 2017-04-30 NOTE — Telephone Encounter (Signed)
Left message for patient to call back  

## 2017-04-30 NOTE — Telephone Encounter (Signed)
-----   Message from Jettie Booze, MD sent at 04/29/2017  3:50 PM EDT ----- NOrmal electrolytes, lipids and liver tests.  COntinue current meds.

## 2017-04-30 NOTE — Telephone Encounter (Signed)
Patient made aware of results. Patient verbalizes understanding.  

## 2017-05-04 ENCOUNTER — Encounter (HOSPITAL_COMMUNITY): Payer: Self-pay

## 2017-05-05 ENCOUNTER — Encounter (HOSPITAL_COMMUNITY): Payer: Self-pay

## 2017-05-07 ENCOUNTER — Encounter (HOSPITAL_COMMUNITY): Payer: Self-pay

## 2017-05-11 ENCOUNTER — Encounter (HOSPITAL_COMMUNITY): Payer: Self-pay

## 2017-05-12 ENCOUNTER — Encounter (HOSPITAL_COMMUNITY): Payer: Self-pay

## 2017-05-14 ENCOUNTER — Encounter (HOSPITAL_COMMUNITY): Payer: Self-pay

## 2017-05-18 ENCOUNTER — Encounter (HOSPITAL_COMMUNITY): Payer: Self-pay

## 2017-05-19 ENCOUNTER — Encounter (HOSPITAL_COMMUNITY): Payer: Self-pay

## 2017-05-21 ENCOUNTER — Encounter (HOSPITAL_COMMUNITY): Payer: Self-pay

## 2017-05-25 ENCOUNTER — Encounter (HOSPITAL_COMMUNITY): Payer: Self-pay

## 2017-05-26 ENCOUNTER — Encounter (HOSPITAL_COMMUNITY): Payer: Self-pay

## 2017-05-28 ENCOUNTER — Encounter (HOSPITAL_COMMUNITY): Payer: Self-pay

## 2017-06-01 ENCOUNTER — Encounter (HOSPITAL_COMMUNITY): Payer: Self-pay

## 2017-06-02 ENCOUNTER — Encounter (HOSPITAL_COMMUNITY): Payer: Self-pay

## 2017-06-04 ENCOUNTER — Encounter (HOSPITAL_COMMUNITY): Payer: Self-pay

## 2017-06-08 ENCOUNTER — Encounter (HOSPITAL_COMMUNITY): Payer: Self-pay

## 2017-06-09 ENCOUNTER — Encounter (HOSPITAL_COMMUNITY): Payer: Self-pay

## 2017-06-11 ENCOUNTER — Encounter (HOSPITAL_COMMUNITY): Payer: Self-pay

## 2017-06-15 ENCOUNTER — Encounter (HOSPITAL_COMMUNITY): Payer: Self-pay

## 2017-06-16 ENCOUNTER — Encounter (HOSPITAL_COMMUNITY): Payer: Self-pay

## 2017-06-18 ENCOUNTER — Encounter (HOSPITAL_COMMUNITY): Payer: Self-pay

## 2017-06-22 ENCOUNTER — Encounter (HOSPITAL_COMMUNITY): Payer: Self-pay

## 2017-06-23 ENCOUNTER — Encounter (HOSPITAL_COMMUNITY): Payer: Self-pay

## 2017-06-25 ENCOUNTER — Encounter (HOSPITAL_COMMUNITY): Payer: Self-pay

## 2017-06-29 ENCOUNTER — Encounter (HOSPITAL_COMMUNITY): Payer: Self-pay

## 2017-06-30 ENCOUNTER — Encounter (HOSPITAL_COMMUNITY): Payer: Self-pay

## 2017-07-02 ENCOUNTER — Encounter (HOSPITAL_COMMUNITY): Payer: Self-pay

## 2017-07-06 ENCOUNTER — Encounter (HOSPITAL_COMMUNITY): Payer: Self-pay

## 2017-07-07 ENCOUNTER — Encounter (HOSPITAL_COMMUNITY): Payer: Self-pay

## 2017-07-09 ENCOUNTER — Encounter (HOSPITAL_COMMUNITY): Payer: Self-pay

## 2017-07-13 ENCOUNTER — Encounter (HOSPITAL_COMMUNITY): Payer: Self-pay

## 2017-07-14 ENCOUNTER — Encounter (HOSPITAL_COMMUNITY): Payer: Self-pay

## 2017-07-16 ENCOUNTER — Encounter (HOSPITAL_COMMUNITY): Payer: Self-pay

## 2017-07-20 ENCOUNTER — Encounter (HOSPITAL_COMMUNITY): Payer: Self-pay

## 2017-07-21 ENCOUNTER — Encounter (HOSPITAL_COMMUNITY): Payer: Self-pay

## 2017-07-23 ENCOUNTER — Encounter (HOSPITAL_COMMUNITY): Payer: Self-pay

## 2017-07-27 ENCOUNTER — Encounter (HOSPITAL_COMMUNITY): Payer: Self-pay

## 2017-07-28 ENCOUNTER — Encounter (HOSPITAL_COMMUNITY): Payer: Self-pay

## 2017-07-30 ENCOUNTER — Encounter (HOSPITAL_COMMUNITY): Payer: Self-pay

## 2017-08-04 ENCOUNTER — Encounter (HOSPITAL_COMMUNITY): Payer: Self-pay

## 2017-08-06 ENCOUNTER — Encounter (HOSPITAL_COMMUNITY): Payer: Self-pay

## 2017-08-10 ENCOUNTER — Encounter (HOSPITAL_COMMUNITY): Payer: Self-pay

## 2017-08-11 ENCOUNTER — Encounter (HOSPITAL_COMMUNITY): Payer: Self-pay

## 2017-08-13 ENCOUNTER — Encounter (HOSPITAL_COMMUNITY): Payer: Self-pay

## 2017-08-17 ENCOUNTER — Encounter (HOSPITAL_COMMUNITY): Payer: Self-pay

## 2017-08-18 ENCOUNTER — Encounter (HOSPITAL_COMMUNITY): Payer: Self-pay

## 2017-08-20 ENCOUNTER — Encounter (HOSPITAL_COMMUNITY): Payer: Self-pay

## 2017-08-24 ENCOUNTER — Encounter (HOSPITAL_COMMUNITY): Payer: Self-pay

## 2017-08-25 ENCOUNTER — Encounter (HOSPITAL_COMMUNITY): Payer: Self-pay

## 2017-08-27 ENCOUNTER — Encounter (HOSPITAL_COMMUNITY): Payer: Self-pay

## 2018-02-12 ENCOUNTER — Telehealth: Payer: Self-pay

## 2018-02-12 MED ORDER — LOSARTAN POTASSIUM 100 MG PO TABS: 50.0000 mg | ORAL_TABLET | Freq: Every day | ORAL | 3 refills | Status: DC

## 2018-02-12 NOTE — Telephone Encounter (Signed)
Received a fax from [facility] stating that the 50 mg losartan tablets are on recall that they can fill the 100 mg tablets. New Rx sent to the pharmacy for Losartan 100 mg: Take 50 mg (1/2 tablet) daily. Called and left detailed message on patient's VM (DPR on file) letting him know of the change. Instructed for him to call back with any questions.

## 2018-02-23 ENCOUNTER — Other Ambulatory Visit: Payer: Self-pay | Admitting: Cardiology

## 2018-05-13 ENCOUNTER — Other Ambulatory Visit: Payer: Self-pay | Admitting: Interventional Cardiology

## 2018-05-20 ENCOUNTER — Other Ambulatory Visit: Payer: Self-pay | Admitting: Interventional Cardiology

## 2018-05-20 MED ORDER — METOPROLOL TARTRATE 25 MG PO TABS: 25.0000 mg | ORAL_TABLET | Freq: Two times a day (BID) | ORAL | 0 refills | Status: DC

## 2018-05-20 MED ORDER — CLOPIDOGREL BISULFATE 75 MG PO TABS: 75.0000 mg | ORAL_TABLET | Freq: Every day | ORAL | 0 refills | Status: DC

## 2018-05-20 NOTE — Addendum Note (Signed)
Addended by: Gar Ponto on: 05/20/2018 04:46 PM   Modules accepted: Orders

## 2018-06-11 ENCOUNTER — Telehealth: Payer: Self-pay | Admitting: Interventional Cardiology

## 2018-06-11 ENCOUNTER — Other Ambulatory Visit: Payer: Self-pay | Admitting: *Deleted

## 2018-06-11 MED ORDER — CLOPIDOGREL BISULFATE 75 MG PO TABS: 75.0000 mg | ORAL_TABLET | Freq: Every day | ORAL | 1 refills | Status: DC

## 2018-06-11 MED ORDER — METOPROLOL TARTRATE 25 MG PO TABS: 12.5000 mg | ORAL_TABLET | Freq: Two times a day (BID) | ORAL | 1 refills | Status: DC

## 2018-06-11 NOTE — Telephone Encounter (Signed)
Spoke with patient to clarify how he is taking the metoprolol as it was last ordered as one tablet by mouth bid, but last office visit indicates that he is taking one-half tablet bid and I do not see where it was changed since his last office visit. He informs me that he is taking one-half tablet bid. I made him aware that I will send in enough to last him until his appointment.

## 2018-06-11 NOTE — Telephone Encounter (Signed)
New message     *STAT* If patient is at the pharmacy, call can be transferred to refill team.    1. Which medications need to be refilled? (please list name of each medication and dose if known)   Nitro   metoprolol tartrate (LOPRESSOR) 25 MG tablet Take 1 tablet (25 mg total) by mouth 2 (two) times daily. Please schedule overdue appt with Dr. Irish Lack for future refills. 2nd attempt.   clopidogrel (PLAVIX) 75 MG tablet Take 1 tablet (75 mg total) by mouth daily. Please schedule overdue appt with Dr. Irish Lack for future refills thank you. 2nd attempt.          2. Which pharmacy/location (including street and city if local pharmacy) is medication to be sent to?[facility] on [address]. Do they need a 30 day or 90 day supply?  Has appt on 07/24/18

## 2018-06-14 ENCOUNTER — Other Ambulatory Visit: Payer: Self-pay | Admitting: *Deleted

## 2018-06-14 MED ORDER — ATORVASTATIN CALCIUM 80 MG PO TABS: 80.0000 mg | ORAL_TABLET | Freq: Every day | ORAL | 1 refills | Status: DC

## 2018-07-22 ENCOUNTER — Ambulatory Visit (INDEPENDENT_AMBULATORY_CARE_PROVIDER_SITE_OTHER): Payer: Self-pay | Admitting: Cardiology

## 2018-07-22 ENCOUNTER — Encounter: Payer: Self-pay | Admitting: Cardiology

## 2018-07-22 VITALS — BP 130/64 | HR 72 | Ht 76.0 in | Wt 271.0 lb

## 2018-07-22 DIAGNOSIS — E782 Mixed hyperlipidemia: Secondary | ICD-10-CM

## 2018-07-22 DIAGNOSIS — Z8679 Personal history of other diseases of the circulatory system: Secondary | ICD-10-CM

## 2018-07-22 DIAGNOSIS — E118 Type 2 diabetes mellitus with unspecified complications: Secondary | ICD-10-CM

## 2018-07-22 DIAGNOSIS — Z794 Long term (current) use of insulin: Secondary | ICD-10-CM

## 2018-07-22 DIAGNOSIS — I10 Essential (primary) hypertension: Secondary | ICD-10-CM

## 2018-07-22 DIAGNOSIS — Z79899 Other long term (current) drug therapy: Secondary | ICD-10-CM

## 2018-07-22 MED ORDER — ATORVASTATIN CALCIUM 80 MG PO TABS: 80.0000 mg | ORAL_TABLET | Freq: Every day | ORAL | 3 refills | Status: DC

## 2018-07-22 MED ORDER — LOSARTAN POTASSIUM 100 MG PO TABS: 50.0000 mg | ORAL_TABLET | Freq: Every day | ORAL | 3 refills | Status: DC

## 2018-07-22 MED ORDER — NITROGLYCERIN 0.4 MG SL SUBL: 0.4000 mg | SUBLINGUAL_TABLET | SUBLINGUAL | 3 refills | Status: DC | PRN

## 2018-07-22 MED ORDER — CLOPIDOGREL BISULFATE 75 MG PO TABS: 75.0000 mg | ORAL_TABLET | Freq: Every day | ORAL | 3 refills | Status: DC

## 2018-07-22 MED ORDER — APIXABAN 5 MG PO TABS: 5.0000 mg | ORAL_TABLET | Freq: Two times a day (BID) | ORAL | 2 refills | Status: DC

## 2018-07-22 MED ORDER — METOPROLOL TARTRATE 25 MG PO TABS: 12.5000 mg | ORAL_TABLET | Freq: Two times a day (BID) | ORAL | 3 refills | Status: DC

## 2018-07-22 NOTE — Patient Instructions (Signed)
Medication Instructions: STOP: Aspirin  START: Eliquis 5 MG twice a day  Labwork: FUTURE: CBC, BMET, LFT, LIPIDS, TSH, Hgb A1c  Procedures/Testing: Your physician has requested that you have an echocardiogram. Echocardiography is a painless test that uses sound waves to create images of your heart. It provides your doctor with information about the size and shape of your heart and how well your heart's chambers and valves are working. This procedure takes approximately one hour. There are no restrictions for this procedure.    Follow-Up: Your physician recommends that you schedule a follow-up appointment in: 3-4 weeks with Ellen Henri PA-C   Any Additional Special Instructions Will Be Listed Below (If Applicable).  Please try to lower intake of caffeine     If you need a refill on your cardiac medications before your next appointment, please call your pharmacy.  '

## 2018-07-22 NOTE — Progress Notes (Signed)
07/22/2018 Derek Herrera   November 02, 1961  [id number]  Primary Physician Renato Shin, MD Primary Cardiologist: Dr. Irish Lack   Reason for Visit/CC: Follow up for CAD, new atrial flutter  HPI:  Derek Herrera is a 57 y.o. male with CAD status post STEMI March 2018 secondary to 99% proximal RCA stenosis treated with a drug-eluting stent, mild systolic heart failure with an EF of 45%, DM, hypertension, hyperlipidemia and remote h/o atrial fibrillation several years ago.  He reports that he had cardioversion about 7 years ago.  He is no longer on anticoagulation.  He also reports prior history of obstructive sleep apnea but was able to come off of CPAP after significant weight loss.  He was lost to follow-up for period of time but reestablished care with cardiology when he had his MI in 2018.  Unfortunately, he has been noncompliant with follow-up.  It has been well over a year since his last office visit.   He now presents to clinic today for follow-up and is in need of medication refills.  He denies any chest pain but reports occasional exertional dyspnea with moderate to heavy physical activity.  He works in the concrete business.  2 days ago, he was on the job in the hot heat performing a strenuous activity when he became short of breath, lightheaded and dizzy.  Again no chest pain.  He stopped to rest and took sublingual nitroglycerin and his dyspnea resolved in about 5 minutes.  He denies any exertional dyspnea with routine day-to-day activities.  He is currently asymptomatic in clinic today.  EKG shows atrial flutter with variable AV block.  Ventricular rate 72 bpm.  Blood pressure is 130/64.  He has not had basic labs in over a year.  Historically,  his renal function has been normal.  His last hemoglobin A1c March 2018 was 6.9.  His last fasting lipid panel March 2018 showed controlled LDL at 57 mg/dL.  Current Meds  Medication Sig  . acetaminophen (TYLENOL) 325 MG tablet Take 2 tablets  (650 mg total) by mouth every 4 (four) hours as needed for headache or mild pain.  Marland Kitchen atorvastatin (LIPITOR) 80 MG tablet Take 1 tablet (80 mg total) by mouth daily.  . clopidogrel (PLAVIX) 75 MG tablet Take 1 tablet (75 mg total) by mouth daily.  . insulin aspart (NOVOLOG) 100 UNIT/ML injection Inject 45 Units into the skin 3 (three) times daily before meals. 45-95 UNITS AS NEEDED  . losartan (COZAAR) 100 MG tablet Take 0.5 tablets (50 mg total) by mouth daily.  . metoprolol tartrate (LOPRESSOR) 25 MG tablet Take 0.5 tablets (12.5 mg total) by mouth 2 (two) times daily.  . nitroGLYCERIN (NITROSTAT) 0.4 MG SL tablet Place 1 tablet (0.4 mg total) under the tongue every 5 (five) minutes as needed for chest pain.  . [DISCONTINUED] aspirin 81 MG chewable tablet Chew 1 tablet (81 mg total) by mouth daily.  . [DISCONTINUED] atorvastatin (LIPITOR) 80 MG tablet Take 1 tablet (80 mg total) by mouth daily. Patient needs to keep upcoming appointment for further refills  . [DISCONTINUED] clopidogrel (PLAVIX) 75 MG tablet Take 1 tablet (75 mg total) by mouth daily. Please keep upcoming appt for futher refills  . [DISCONTINUED] losartan (COZAAR) 100 MG tablet Take 0.5 tablets (50 mg total) by mouth daily.  . [DISCONTINUED] metoprolol tartrate (LOPRESSOR) 25 MG tablet Take 0.5 tablets (12.5 mg total) by mouth 2 (two) times daily. Please keep upcoming appt for futher refills  . [DISCONTINUED] nitroGLYCERIN (  NITROSTAT) 0.4 MG SL tablet Place 1 tablet (0.4 mg total) under the tongue every 5 (five) minutes as needed for chest pain.   No Known Allergies Past Medical History:  Diagnosis Date  . Anemia   . Clotting disorder ([place])   . DIABETES MELLITUS, TYPE I 06/29/2007  . DM nephropathy/sclerosis   . HYPERCHOLESTEROLEMIA 05/20/2010  . HYPERTENSION 06/29/2007  . Lytic lesion of bone on x-ray 09/29/2012   Expansile lesion Left lateral rib on CT 09/23/12  Chest otherwise negative  . Obstructive sleep apnea    moderate  by sleep study 11/13  . Persistent atrial fibrillation ([facility])    Family History  Problem Relation Age of Onset  . Diabetes Maternal Grandmother   . Bone cancer Maternal Grandmother   . Cancer Paternal Grandfather    Past Surgical History:  Procedure Laterality Date  . CARDIOVERSION  09/03/2012   Procedure: CARDIOVERSION;  Surgeon: Fay Records, MD;  [facility] END[facility];  Service: Cardiovascular;  Laterality: N/A;  left message with leb RN to clarify if mcalhaney is doing this procedure/dl/10 2  . CORONARY/GRAFT ACUTE MI REVASCULARIZATION N/A 02/15/2017   Procedure: Coronary/Graft Acute MI Revascularization;  Surgeon: Jettie Booze, MD;  Location: [facility] CV LAB;  Service: Cardiovascular;  Laterality: N/A;  . KNEE SURGERY     x's 3  . LEFT HEART CATH AND CORONARY ANGIOGRAPHY N/A 02/15/2017   Procedure: Left Heart Cath and Coronary Angiography;  Surgeon: Jettie Booze, MD;  Location: [place] CV LAB;  Service: Cardiovascular;  Laterality: N/A;  . TEE WITH CARDIOVERSION  08/02/2011   EF of approximately 50-55% conversion to sinus rhythm.  There were no apparent complications  . WRIST FRACTURE SURGERY  right   Social History   Socioeconomic History  . Marital status: Married    Spouse name: Not on file  . Number of children: 5  . Years of education: Not on file  . Highest education level: Not on file  Occupational History  . 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 Financial resource strain: Not on file  . Food insecurity:    Worry: Not on file    Inability: Not on file  . Transportation needs:    Medical: Not on file    Non-medical: Not on file  Tobacco Use  . Smoking status: Former Smoker    Packs/day: 1.00    Years: 20.00    Pack years: 20.00    Types: Cigarettes    Last attempt to quit: 12/01/1990    Years since quitting: 27.6  . Smokeless tobacco: Former Systems developer    Types: Chew, Snuff  . Tobacco  comment: smokked off an on x 20 yrs 09/20/12  Substance and Sexual Activity  . Alcohol use: No  . Drug use: No  . Sexual activity: Not Currently  Lifestyle  . Physical activity:    Days per week: Not on file    Minutes per session: Not on file  . Stress: Not on file  Relationships  . Social connections:    Talks on phone: Not on file    Gets together: Not on file    Attends religious service: Not on file    Active member of club or organization: Not on file    Attends meetings of clubs or organizations: Not on file    Relationship status: Not on file  . Intimate partner violence:    Fear  of current or ex partner: Not on file    Emotionally abused: Not on file    Physically abused: Not on file    Forced sexual activity: Not on file  Other Topics Concern  . Not on file  Social History Narrative  . Not on file     Review of Systems: General: negative for chills, fever, night sweats or weight changes.  Cardiovascular: negative for chest pain, dyspnea on exertion, edema, orthopnea, palpitations, paroxysmal nocturnal dyspnea or shortness of breath Dermatological: negative for rash Respiratory: negative for cough or wheezing Urologic: negative for hematuria Abdominal: negative for nausea, vomiting, diarrhea, bright red blood per rectum, melena, or hematemesis Neurologic: negative for visual changes, syncope, or dizziness All other systems reviewed and are otherwise negative except as noted above.   Physical Exam:  Blood pressure 130/64, pulse 72, height 6\' 4"  (1.93 m), weight 271 lb (122.9 kg), SpO2 98 %.  General appearance: alert, cooperative and no distress Neck: no carotid bruit and no JVD Lungs: clear to auscultation bilaterally Heart: iregular rhythm, regular rate S1, S2 normal, no murmur, click, rub or gallop Extremities: extremities normal, atraumatic, no cyanosis or edema Pulses: 2+ and symmetric Skin: Skin color, texture, turgor normal. No rashes or  lesions Neurologic: Grossly normal  EKG atrial flutter, 72 bpm-- personally reviewed   ASSESSMENT AND PLAN:   1.  CAD: s/p STEMI March 2018 secondary to proximal occlusion of the RCA.  This was treated with PCI utilizing a synergy drug-eluting stent.  He denies any chest pain but has had some recent exertional dyspnea with moderate to heavy physical activity however this is in the setting of new atrial flutter.  He is currently asymptomatic in clinic today.  We will continue medical therapy with Plavix.  Stopping aspirin due to addition of Eliquis for atrial flutter.  Continue beta-blocker and statin.  2.  Atrial flutter: New documentation of atrial flutter.  Per patient report, he has a history of atrial fibrillation many years ago and required cardioversion roughly 7 years ago.  He has had no documented recurrence of any atrial arrthymias until today.  He is not currently on any anticoagulation.  Luckily, his ventricular rate is controlled in the 70s and he is currently asymptomatic.  We will continue metoprolol for rate control.  I discussed anticoagulation with his primary cardiologist, Dr. Irish Lack.  We will stop aspirin and continue Plavix.  We will add Eliquis 5 mg twice daily for stroke prophylaxis.  We will obtain CBC and basic metabolic panel today for baseline assessment of his H&H and creatinine.  We will also check a TSH and order a 2D echo.  We will have the patient return in 3 to 4 weeks with an APP for repeat assessment and repeat EKG.  If he remains in atrial flutter we will set him up for outpatient direct-current cardioversion.  Patient was also advised to reduce his caffeine intake.  He reports that he typically drinks on average 3 cups of coffee daily.  He was also advised to alert [place] if any developments of abnormal bleeding or if he has any falls resulting in head injury.  He verbalized understanding.  3.  Hypertension: Controlled on current regimen at 130/64.  He is currently on  metoprolol and losartan.  Continue current regimen.  We will check a basic metabolic panel today to ensure that renal function electrolytes are stable.  4.  Hyperlipidemia: On statin therapy with Lipitor 80 mg however has not had a fasting  lipid panel in well over a year.  His goal LDL in the setting of CAD is less than 70 mg/dL.  We will obtain fasting lipid panel and hepatic function test.  5.  Diabetes: He has not seen his PCP in well over a year.  His last hemoglobin A1c was in March 2018 and was controlled at that time at 6.9.  We will order repeat hemoglobin A1c to see how well controlled his diabetes is.   Follow-Up in 3-4 weeks for repeat EKG.   Brittainy Ladoris Gene, MHS [facility] 07/22/2018 12:04 PM

## 2018-07-23 ENCOUNTER — Other Ambulatory Visit: Payer: Self-pay | Admitting: *Deleted

## 2018-07-23 DIAGNOSIS — Z79899 Other long term (current) drug therapy: Secondary | ICD-10-CM

## 2018-07-23 DIAGNOSIS — E782 Mixed hyperlipidemia: Secondary | ICD-10-CM

## 2018-07-23 DIAGNOSIS — Z794 Long term (current) use of insulin: Secondary | ICD-10-CM

## 2018-07-23 DIAGNOSIS — Z8679 Personal history of other diseases of the circulatory system: Secondary | ICD-10-CM

## 2018-07-23 DIAGNOSIS — I10 Essential (primary) hypertension: Secondary | ICD-10-CM

## 2018-07-23 DIAGNOSIS — E118 Type 2 diabetes mellitus with unspecified complications: Secondary | ICD-10-CM

## 2018-07-23 LAB — HEPATIC FUNCTION PANEL
ALT: 18 IU/L (ref 0–44)
AST: 17 IU/L (ref 0–40)
Albumin: 4.1 g/dL (ref 3.5–5.5)
Alkaline Phosphatase: 65 IU/L (ref 39–117)
BILIRUBIN TOTAL: 0.8 mg/dL (ref 0.0–1.2)
BILIRUBIN, DIRECT: 0.26 mg/dL (ref 0.00–0.40)
Total Protein: 6.4 g/dL (ref 6.0–8.5)

## 2018-07-23 LAB — TSH: TSH: 3.38 u[IU]/mL (ref 0.450–4.500)

## 2018-07-23 LAB — LIPID PANEL
Chol/HDL Ratio: 2.3 ratio (ref 0.0–5.0)
Cholesterol, Total: 102 mg/dL (ref 100–199)
HDL: 44 mg/dL (ref >39)
LDL CALC: 47 mg/dL (ref 0–99)
Triglycerides: 54 mg/dL (ref 0–149)
VLDL Cholesterol Cal: 11 mg/dL (ref 5–40)

## 2018-07-24 LAB — BASIC METABOLIC PANEL
BUN / CREAT RATIO: 14 (ref 9–20)
BUN: 13 mg/dL (ref 6–24)
CO2: 23 mmol/L (ref 20–29)
CREATININE: 0.92 mg/dL (ref 0.76–1.27)
Calcium: 9.1 mg/dL (ref 8.7–10.2)
Chloride: 101 mmol/L (ref 96–106)
GFR calc non Af Amer: 92 mL/min/{1.73_m2} (ref >59)
GFR, EST AFRICAN AMERICAN: 106 mL/min/{1.73_m2} (ref >59)
Glucose: 183 mg/dL — ABNORMAL HIGH (ref 65–99)
Potassium: 5.1 mmol/L (ref 3.5–5.2)
Sodium: 136 mmol/L (ref 134–144)

## 2018-07-24 LAB — CBC
HEMATOCRIT: 43.2 % (ref 37.5–51.0)
HEMOGLOBIN: 14.1 g/dL (ref 13.0–17.7)
MCH: 28.8 pg (ref 26.6–33.0)
MCHC: 32.6 g/dL (ref 31.5–35.7)
MCV: 88 fL (ref 79–97)
Platelets: 220 10*3/uL (ref 150–450)
RBC: 4.89 x10E6/uL (ref 4.14–5.80)
RDW: 14.2 % (ref 12.3–15.4)
WBC: 7.7 10*3/uL (ref 3.4–10.8)

## 2018-07-24 LAB — HEMOGLOBIN A1C
Est. average glucose Bld gHb Est-mCnc: 166 mg/dL
Hgb A1c MFr Bld: 7.4 % — ABNORMAL HIGH (ref 4.8–5.6)

## 2018-07-27 ENCOUNTER — Telehealth: Payer: Self-pay | Admitting: Cardiology

## 2018-07-27 ENCOUNTER — Other Ambulatory Visit: Payer: Self-pay

## 2018-07-27 DIAGNOSIS — Z79899 Other long term (current) drug therapy: Secondary | ICD-10-CM

## 2018-07-27 NOTE — Telephone Encounter (Signed)
New Message    Patient is returning call in reference to lab results. Patient states that lab results can be left on voicemail if not able to make contact with him.

## 2018-07-27 NOTE — Progress Notes (Unsigned)
CBC

## 2018-07-27 NOTE — Telephone Encounter (Signed)
Notes recorded by Frederik Schmidt, RN on 07/27/2018 at 8:49 AM EDT Informed patient of results/recommendations. He verbalized understanding. ------

## 2018-07-27 NOTE — Telephone Encounter (Signed)
-----   Message from Consuelo Pandy, [place] sent at 07/24/2018 10:41 AM EDT ----- All labs are normal except for Hgb A1c. Slightly above the recommended goal of < 7.0 for DM. Level is 7.4. F/u with PCP for further monitoring/ management. Cholesterol looks good and levels are at goal. His kidney function, liver function and blood counts are normal. Continue Eliquis BID. Stress importance of strict compliance and do not miss any doses. He will need to take Eliquis twice daily for at least 3 weeks before we can arrange for cardioversion. Make sure pt has f/u office visit in 3-4 weeks for repeat EKG to see if still in afib. We can repeat a CBC at that visit to ensure Hgb is stable with blood thinner.

## 2018-07-27 NOTE — Telephone Encounter (Signed)
Spoke to patient with lab results/recommendations.  He verbalized understanding and we scheduled a f/u appt with [place] 9/30.

## 2018-07-28 ENCOUNTER — Other Ambulatory Visit: Payer: Self-pay

## 2018-07-28 ENCOUNTER — Ambulatory Visit (HOSPITAL_COMMUNITY): Payer: Self-pay | Attending: Cardiology

## 2018-07-28 ENCOUNTER — Telehealth: Payer: Self-pay | Admitting: Cardiology

## 2018-07-28 DIAGNOSIS — I252 Old myocardial infarction: Secondary | ICD-10-CM | POA: Insufficient documentation

## 2018-07-28 DIAGNOSIS — I080 Rheumatic disorders of both mitral and aortic valves: Secondary | ICD-10-CM | POA: Insufficient documentation

## 2018-07-28 DIAGNOSIS — I2510 Atherosclerotic heart disease of native coronary artery without angina pectoris: Secondary | ICD-10-CM | POA: Insufficient documentation

## 2018-07-28 DIAGNOSIS — I4892 Unspecified atrial flutter: Secondary | ICD-10-CM | POA: Insufficient documentation

## 2018-07-28 DIAGNOSIS — E785 Hyperlipidemia, unspecified: Secondary | ICD-10-CM | POA: Insufficient documentation

## 2018-07-28 DIAGNOSIS — Z8679 Personal history of other diseases of the circulatory system: Secondary | ICD-10-CM

## 2018-07-28 NOTE — Telephone Encounter (Signed)
Returned pts call and left a detailed message on his voicemail, per request of pt, of his lab results.

## 2018-07-28 NOTE — Telephone Encounter (Signed)
New Message        Patient calling asking for lab results, patient states it's ok to leave message on vmail. He just want to know what the results are.

## 2018-08-30 ENCOUNTER — Ambulatory Visit: Payer: Self-pay | Admitting: Cardiology

## 2018-08-30 ENCOUNTER — Other Ambulatory Visit: Payer: Self-pay

## 2018-09-23 ENCOUNTER — Ambulatory Visit: Payer: Self-pay | Admitting: Interventional Cardiology

## 2018-10-12 DIAGNOSIS — I4892 Unspecified atrial flutter: Secondary | ICD-10-CM | POA: Insufficient documentation

## 2018-10-12 NOTE — Progress Notes (Signed)
Cardiology Office Note    Date:  10/13/2018   ID:  Hao, Dion [birth year], MRN [id number]  PCP:  Renato Shin, MD  Cardiologist: Larae Grooms, MD EPS: None  Chief Complaint  Patient presents with  . Follow-up    History of Present Illness:  Derek Herrera is a 57 y.o. male with history of CAD status post STEMI 01/2017 treated with DES to the proximal RCA, LVEF 45% with chronic systolic CHF, hypertension, HLD, DM.  Remote history of atrial fibrillation with cardioversion 7 years ago had been off anticoagulation.  Patient had not followed up regularly and was seen by Ellen Henri, PA-C 07/22/2018 at which time he was in atrial flutter with variable AV block ventricular rate 72 bpm.  She did discussed with Dr. Irish Lack who recommended continuing Plavix, stopping aspirin) Eliquis 5 mg twice daily.  Follow-up in 3 to 4 weeks to schedule for DCCV.  2D echo showed normal LVEF 50 to 55% mild MR and mildly dilated left atrium.  Patient was in atrial flutter during the study.  Patient comes in today for f/u.  He stopped his Eliquis after 30 days because he has no insurance and it would cost him $600.  He also says he cannot afford cardioversion right now.  He is looking into insurance but has not found anything affordable.  He is asymptomatic with the atrial flutter.    Past Medical History:  Diagnosis Date  . Anemia   . Clotting disorder ([place])   . DIABETES MELLITUS, TYPE I 06/29/2007  . DM nephropathy/sclerosis   . HYPERCHOLESTEROLEMIA 05/20/2010  . HYPERTENSION 06/29/2007  . Lytic lesion of bone on x-ray 09/29/2012   Expansile lesion Left lateral rib on CT 09/23/12  Chest otherwise negative  . Obstructive sleep apnea    moderate by sleep study 11/13  . Persistent atrial fibrillation     Past Surgical History:  Procedure Laterality Date  . CARDIOVERSION  09/03/2012   Procedure: CARDIOVERSION;  Surgeon: Fay Records, MD;  Location: [facility] ENDOSCOPY;  Service:  Cardiovascular;  Laterality: N/A;  left message with leb RN to clarify if mcalhaney is doing this procedure/dl/10 2  . CORONARY/GRAFT ACUTE MI REVASCULARIZATION N/A 02/15/2017   Procedure: Coronary/Graft Acute MI Revascularization;  Surgeon: Jettie Booze, MD;  Location: [place] CV LAB;  Service: Cardiovascular;  Laterality: N/A;  . KNEE SURGERY     x's 3  . LEFT HEART CATH AND CORONARY ANGIOGRAPHY N/A 02/15/2017   Procedure: Left Heart Cath and Coronary Angiography;  Surgeon: Jettie Booze, MD;  Location: [place] CV LAB;  Service: Cardiovascular;  Laterality: N/A;  . TEE WITH CARDIOVERSION  08/02/2011   EF of approximately 50-55% conversion to sinus rhythm.  There were no apparent complications  . WRIST FRACTURE SURGERY  right    Current Medications: Current Meds  Medication Sig  . acetaminophen (TYLENOL) 325 MG tablet Take 2 tablets (650 mg total) by mouth every 4 (four) hours as needed for headache or mild pain.  Marland Kitchen atorvastatin (LIPITOR) 80 MG tablet Take 1 tablet (80 mg total) by mouth daily.  . clopidogrel (PLAVIX) 75 MG tablet Take 1 tablet (75 mg total) by mouth daily.  . insulin aspart (NOVOLOG) 100 UNIT/ML injection Inject 45 Units into the skin 3 (three) times daily before meals. 45-95 UNITS AS NEEDED  . losartan (COZAAR) 100 MG tablet Take 0.5 tablets (50 mg total) by mouth daily.  . metoprolol tartrate (LOPRESSOR) 25 MG tablet Take 0.5  tablets (12.5 mg total) by mouth 2 (two) times daily.  . nitroGLYCERIN (NITROSTAT) 0.4 MG SL tablet Place 1 tablet (0.4 mg total) under the tongue every 5 (five) minutes as needed for chest pain.     Allergies:   Patient has no known allergies.   Social History   Socioeconomic History  . Marital status: Married    Spouse name: Not on file  . Number of children: 5  . Years of education: Not on file  . Highest education level: Not on file  Occupational History  . 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 Financial resource strain: Not on file  . Food insecurity:    Worry: Not on file    Inability: Not on file  . Transportation needs:    Medical: Not on file    Non-medical: Not on file  Tobacco Use  . Smoking status: Former Smoker    Packs/day: 1.00    Years: 20.00    Pack years: 20.00    Types: Cigarettes    Last attempt to quit: 12/01/1990    Years since quitting: 27.8  . Smokeless tobacco: Former Systems developer    Types: Chew, Snuff  . Tobacco comment: smokked off an on x 20 yrs 09/20/12  Substance and Sexual Activity  . Alcohol use: No  . Drug use: No  . Sexual activity: Not Currently  Lifestyle  . Physical activity:    Days per week: Not on file    Minutes per session: Not on file  . Stress: Not on file  Relationships  . Social connections:    Talks on phone: Not on file    Gets together: Not on file    Attends religious service: Not on file    Active member of club or organization: Not on file    Attends meetings of clubs or organizations: Not on file    Relationship status: Not on file  Other Topics Concern  . Not on file  Social History Narrative  . Not on file     Family History:  The patient's   family history includes Bone cancer in his maternal grandmother; Cancer in his paternal grandfather; Diabetes in his maternal grandmother.   ROS:   Please see the history of present illness.    Review of Systems  Constitution: Negative.  HENT: Negative.   Cardiovascular: Negative.   Respiratory: Negative.   Endocrine: Negative.   Hematologic/Lymphatic: Negative.   Musculoskeletal: Negative.   Gastrointestinal: Negative.   Genitourinary: Negative.   Neurological: Negative.    All other systems reviewed and are negative.   PHYSICAL EXAM:   VS:  BP 122/88   Pulse 73   Ht 6\' 4"  (1.93 m)   Wt 274 lb 1.9 oz (124.3 kg)   BMI 33.37 kg/m   Physical Exam  GEN: Well nourished, well developed, in no acute distress  Neck: no JVD,  carotid bruits, or masses Cardiac:RRR; no murmurs, rubs, or gallops  Respiratory:  clear to auscultation bilaterally, normal work of breathing GI: soft, nontender, nondistended, + BS Ext: without cyanosis, clubbing, or edema, Good distal pulses bilaterally Neuro:  Alert and Oriented x 3 Psych: euthymic mood, full affect  Wt Readings from Last 3 Encounters:  10/13/18 274 lb 1.9 oz (124.3 kg)  07/22/18 271 lb (122.9 kg)  04/23/17 271 lb 1.9 oz (123 kg)      Studies/Labs Reviewed:   EKG:  EKG is  ordered today.  The ekg ordered today demonstrates atrial flutter at 73 bpm, no acute change  Recent Labs: 07/23/2018: ALT 18; BUN 13; Creatinine, Ser 0.92; Hemoglobin 14.1; Platelets 220; Potassium 5.1; Sodium 136; TSH 3.380   Lipid Panel    Component Value Date/Time   CHOL 102 07/23/2018 0818   TRIG 54 07/23/2018 0818   HDL 44 07/23/2018 0818   CHOLHDL 2.3 07/23/2018 0818   CHOLHDL 5.0 02/16/2017 0329   VLDL 23 02/16/2017 0329   LDLCALC 47 07/23/2018 0818    Additional studies/ records that were reviewed today include:  2D echo 07/28/2018  Study Conclusions   - Left ventricle: The cavity size was mildly dilated. Wall   thickness was increased in a pattern of mild LVH. Systolic   function was normal. The estimated ejection fraction was in the   range of 50% to 55%. Wall motion was normal; there were no   regional wall motion abnormalities. Left ventricular diastolic   function parameters were normal. - Aortic valve: Trileaflet; moderately thickened, moderately   calcified leaflets. - Mitral valve: There was mild regurgitation. - Left atrium: The atrium was mildly dilated. Volume/bsa, ES   (1-plane Simpson&'s, A4C): 36.4 ml/m^2. - Right ventricle: The cavity size was mildly dilated. Wall   thickness was normal.   Impressions:   - Atrial flutter present.   Cardiac catheterization 02/15/2017  There is mild global left ventricular systolic dysfunction.  The left  ventricular ejection fraction is 45-50% by visual estimate.  LV end diastolic pressure is moderately elevated.  Mild left coronary system disease.  Prox RCA lesion, 99 %stenosed. A STENT SYNERGY DES 3X20 drug eluting stent was successfully placed, postdilated to 3.66 mm.  Post intervention, there is a 0% residual stenosis.   Continue IV tirofiban for 4 hours post procedure. He'll need to stay on dual antiplatelet therapy for at least a year. He'll need aggressive secondary prevention including good lowering therapy and beta blocker. Given his diabetes, would also start ARB. He was on losartan in the past but this was stopped. We'll restart this at a low dose.   I anticipate he will be in the hospital for 2 days.     We'll need to check hemoglobin A1c as well. He would benefit from cardiac rehabilitation.    ASSESSMENT:    1. Atrial flutter, unspecified type ([place])   2. History of atrial fibrillation   3. Coronary artery disease involving native coronary artery of native heart with other form of angina pectoris ([place])   4. Essential hypertension   5. Mixed hyperlipidemia      PLAN:  In order of problems listed above:  Atrial flutter with controlled ventricular rate on metoprolol.  CHA2DS2-VASc equals 3, has been on Eliquis 30 days but then could not afford anymore.  We will give him samples to restart and try to get him patient assistance.  We will also refer him to social worker to help assist in finding insurance.  History of atrial fibrillation cardioversion 7 years ago  CAD status post STEMI 01/2017 treated with DES to the proximal RCA on Plavix.  Aspirin stopped because of Eliquis.  Essential hypertension controlled  Mixed hyperlipidemia on Lipitor LDL 47 07/2018  Medication Adjustments/Labs and Tests Ordered: Current medicines are reviewed at length with the patient today.  Concerns regarding medicines are outlined above.  Medication changes, Labs and Tests ordered today are  listed in the Patient Instructions below. Patient Instructions  Medication Instructions:  Your physician has recommended you make the following change in your medication:  1.  RESTART Eliquis 5 mg taking 1 tablet twice a day   If you need a refill on your cardiac medications before your next appointment, please call your pharmacy.   Lab work: None ordered  If you have labs (blood work) drawn today and your tests are completely normal, you will receive your results only by: Marland Kitchen MyChart Message (if you have MyChart) OR . A paper copy in the mail If you have any lab test that is abnormal or we need to change your treatment, we will call you to review the results.  Testing/Procedures: None orderd  Follow-Up: Your physician recommends that you schedule a follow-up appointment in: NEXT AVAILABLE WITH DR. VARANASI   Any Other Special Instructions Will Be Listed Below (If Applicable).       Sumner Boast, PA-C  10/13/2018 9:02 AM    [facility] [place], [place], [place]  [address] Phone: [phone]; Fax: [phone]

## 2018-10-13 ENCOUNTER — Ambulatory Visit (INDEPENDENT_AMBULATORY_CARE_PROVIDER_SITE_OTHER): Payer: Self-pay | Admitting: Physician Assistant

## 2018-10-13 ENCOUNTER — Encounter: Payer: Self-pay | Admitting: Physician Assistant

## 2018-10-13 VITALS — BP 122/88 | HR 73 | Ht 76.0 in | Wt 274.1 lb

## 2018-10-13 DIAGNOSIS — I25118 Atherosclerotic heart disease of native coronary artery with other forms of angina pectoris: Secondary | ICD-10-CM

## 2018-10-13 DIAGNOSIS — I4892 Unspecified atrial flutter: Secondary | ICD-10-CM

## 2018-10-13 DIAGNOSIS — E782 Mixed hyperlipidemia: Secondary | ICD-10-CM

## 2018-10-13 DIAGNOSIS — I10 Essential (primary) hypertension: Secondary | ICD-10-CM

## 2018-10-13 DIAGNOSIS — Z8679 Personal history of other diseases of the circulatory system: Secondary | ICD-10-CM

## 2018-10-13 NOTE — Patient Instructions (Addendum)
Medication Instructions:  Your physician has recommended you make the following change in your medication:  1.  RESTART Eliquis 5 mg taking 1 tablet twice a day   If you need a refill on your cardiac medications before your next appointment, please call your pharmacy.   Lab work: None ordered  If you have labs (blood work) drawn today and your tests are completely normal, you will receive your results only by: Marland Kitchen MyChart Message (if you have MyChart) OR . A paper copy in the mail If you have any lab test that is abnormal or we need to change your treatment, we will call you to review the results.  Testing/Procedures: None orderd  Follow-Up: Your physician recommends that you schedule a follow-up appointment in: NEXT AVAILABLE WITH DR. VARANASI   Any Other Special Instructions Will Be Listed Below (If Applicable).

## 2018-10-14 ENCOUNTER — Telehealth (HOSPITAL_COMMUNITY): Payer: Self-pay | Admitting: Licensed Clinical Social Worker

## 2018-10-14 NOTE — Telephone Encounter (Signed)
CSW received referral from Cardiology Office. Patient states he works although has no insurance. CSW discussed contacting Financial Counseling to discuss possible [employer] Program and also provided number for [place] to explore option for purchasing a plan. Patient grateful for the assistance and plans to follow up. CSW available as needed. Derek Herrera, [place], [place]

## 2018-10-20 ENCOUNTER — Ambulatory Visit (HOSPITAL_COMMUNITY): Admit: 2018-10-20 | Payer: Self-pay | Admitting: Cardiology

## 2018-10-20 ENCOUNTER — Encounter (HOSPITAL_COMMUNITY): Payer: Self-pay

## 2018-10-20 SURGERY — CARDIOVERSION
Anesthesia: General

## 2018-11-11 ENCOUNTER — Ambulatory Visit: Payer: Self-pay | Admitting: Interventional Cardiology

## 2018-11-11 ENCOUNTER — Ambulatory Visit (INDEPENDENT_AMBULATORY_CARE_PROVIDER_SITE_OTHER): Payer: Self-pay | Admitting: Interventional Cardiology

## 2018-11-11 ENCOUNTER — Encounter: Payer: Self-pay | Admitting: Interventional Cardiology

## 2018-11-11 VITALS — BP 122/86 | HR 61 | Ht 76.0 in | Wt 277.8 lb

## 2018-11-11 DIAGNOSIS — E782 Mixed hyperlipidemia: Secondary | ICD-10-CM

## 2018-11-11 DIAGNOSIS — I4892 Unspecified atrial flutter: Secondary | ICD-10-CM

## 2018-11-11 DIAGNOSIS — Z8679 Personal history of other diseases of the circulatory system: Secondary | ICD-10-CM

## 2018-11-11 DIAGNOSIS — I10 Essential (primary) hypertension: Secondary | ICD-10-CM

## 2018-11-11 DIAGNOSIS — E118 Type 2 diabetes mellitus with unspecified complications: Secondary | ICD-10-CM

## 2018-11-11 DIAGNOSIS — Z794 Long term (current) use of insulin: Secondary | ICD-10-CM

## 2018-11-11 DIAGNOSIS — I25118 Atherosclerotic heart disease of native coronary artery with other forms of angina pectoris: Secondary | ICD-10-CM

## 2018-11-11 DIAGNOSIS — I252 Old myocardial infarction: Secondary | ICD-10-CM

## 2018-11-11 NOTE — Progress Notes (Signed)
Cardiology Office Note   Date:  11/11/2018   ID:  Derek, Herrera [birth year], MRN [id number]  PCP:  Leonard Downing, MD    No chief complaint on file.  CAD  Wt Readings from Last 3 Encounters:  11/11/18 277 lb 12.8 oz (126 kg)  10/13/18 274 lb 1.9 oz (124.3 kg)  07/22/18 271 lb (122.9 kg)       History of Present Illness: Derek Herrera is a 57 y.o. male  with history of CAD status post STEMI 01/2017 treated with DES to the proximal RCA, LVEF 45% with chronic systolic CHF, hypertension, HLD, DM.  Remote history of atrial fibrillation with cardioversion 7 years ago had been off anticoagulation.  Patient had not followed up regularly and was seen by Derek Henri, PA-C 07/22/2018 at which time he was in atrial flutter with variable AV block ventricular rate 72 bpm.  He was changed to Eliquis and Plavix, no aspirin.  He was supposed to have , but this did not happen.    He has had cardioversion years ago, TEE/CV in 2012 ([facility]); and DCCV in 2013 (Dr. Harrington Challenger).   Eliquis and cardioversion were going to be too expensive.  He was looking for insurance.   He remains active.  He does some walking.  No cardiac sx with this.    Denies : Chest pain. Dizziness. Leg edema. Nitroglycerin use. Orthopnea. Palpitations. Paroxysmal nocturnal dyspnea. Shortness of breath. Syncope.   Past Medical History:  Diagnosis Date  . Anemia   . Clotting disorder ([place])   . DIABETES MELLITUS, TYPE I 06/29/2007  . DM nephropathy/sclerosis   . HYPERCHOLESTEROLEMIA 05/20/2010  . HYPERTENSION 06/29/2007  . Lytic lesion of bone on x-ray 09/29/2012   Expansile lesion Left lateral rib on CT 09/23/12  Chest otherwise negative  . Obstructive sleep apnea    moderate by sleep study 11/13  . Persistent atrial fibrillation     Past Surgical History:  Procedure Laterality Date  . CARDIOVERSION  09/03/2012   Procedure: CARDIOVERSION;  Surgeon: Fay Records, MD;  Location: [facility] ENDOSCOPY;   Service: Cardiovascular;  Laterality: N/A;  left message with leb RN to clarify if mcalhaney is doing this procedure/dl/10 2  . CORONARY/GRAFT ACUTE MI REVASCULARIZATION N/A 02/15/2017   Procedure: Coronary/Graft Acute MI Revascularization;  Surgeon: Jettie Booze, MD;  Location: [place] [facility] LAB;  Service: Cardiovascular;  Laterality: N/A;  . KNEE SURGERY     x's 3  . LEFT HEART CATH AND CORONARY ANGIOGRAPHY N/A 02/15/2017   Procedure: Left Heart Cath and Coronary Angiography;  Surgeon: Jettie Booze, MD;  Location: [place] [facility];  Service: Cardiovascular;  Laterality: N/A;  . TEE WITH CARDIOVERSION  08/02/2011   EF of approximately 50-55% conversion to sinus rhythm.  There were no apparent complications  . WRIST FRACTURE SURGERY  right     Current Outpatient Medications  Medication Sig Dispense Refill  . acetaminophen (TYLENOL) 325 MG tablet Take 2 tablets (650 mg total) by mouth every 4 (four) hours as needed for headache or mild pain.    Marland Kitchen atorvastatin (LIPITOR) 80 MG tablet Take 1 tablet (80 mg total) by mouth daily. 90 tablet 3  . clopidogrel (PLAVIX) 75 MG tablet Take 1 tablet (75 mg total) by mouth daily. 90 tablet 3  . insulin aspart (NOVOLOG) 100 UNIT/ML injection Inject 45 Units into the skin 3 (three) times daily before meals. 45-95 UNITS AS NEEDED    . losartan (COZAAR)  100 MG tablet Take 0.5 tablets (50 mg total) by mouth daily. 45 tablet 3  . metoprolol tartrate (LOPRESSOR) 25 MG tablet Take 0.5 tablets (12.5 mg total) by mouth 2 (two) times daily. 180 tablet 3  . nitroGLYCERIN (NITROSTAT) 0.4 MG SL tablet Place 1 tablet (0.4 mg total) under the tongue every 5 (five) minutes as needed for chest pain. 25 tablet 3   No current facility-administered medications for this visit.     Allergies:   Patient has no known allergies.    Social History:  The patient  reports that he quit smoking about 27 years ago. His smoking use included cigarettes. He has a 20.00  pack-year smoking history. He has quit using smokeless tobacco.  His smokeless tobacco use included chew and snuff. He reports that he does not drink alcohol or use drugs.   Family History:  The patient's family history includes Bone cancer in his maternal grandmother; Cancer in his paternal grandfather; Diabetes in his maternal grandmother.    ROS:  Please see the history of present illness.   Otherwise, review of systems are positive for occasional palpitations.   All other systems are reviewed and negative.    PHYSICAL EXAM: VS:  BP 122/86   Pulse 61   Ht 6\' 4"  (1.93 m)   Wt 277 lb 12.8 oz (126 kg)   SpO2 97%   BMI 33.81 kg/m  , BMI Body mass index is 33.81 kg/m. GEN: Well nourished, well developed, in no acute distress  HEENT: normal  Neck: no JVD, carotid bruits, or masses Cardiac: regularly irregular- occasional tachycardia; no murmurs, rubs, or gallops,tr LE edema  Respiratory:  clear to auscultation bilaterally, normal work of breathing GI: soft, nontender, nondistended, + BS MS: no deformity or atrophy  Skin: warm and dry, no rash Neuro:  Strength and sensation are intact Psych: euthymic mood, full affect    Recent Labs: 07/23/2018: ALT 18; BUN 13; Creatinine, Ser 0.92; Hemoglobin 14.1; Platelets 220; Potassium 5.1; Sodium 136; TSH 3.380   Lipid Panel    Component Value Date/Time   CHOL 102 07/23/2018 0818   TRIG 54 07/23/2018 0818   HDL 44 07/23/2018 0818   CHOLHDL 2.3 07/23/2018 0818   CHOLHDL 5.0 02/16/2017 0329   VLDL 23 02/16/2017 0329   LDLCALC 47 07/23/2018 0818     Other studies Reviewed: Additional studies/ records that were reviewed today with results demonstrating: prior records reviewed.  Labs from August 2019 reviewed.  LDL 47.  Normal creatinine.   ASSESSMENT AND PLAN:  1. CAD: No angina.  Continue aggressive secondary prevention.  Will add back aspirin 81 mg daily in addition to Plavix.  No bleeding issues.  2. Atrial flutter: Rate  controlled.  He has still not found insurance.  That was the limiting thing for him with both Eliquis and cardioversion.  He is not interested in further management of his atrial flutter at this time since he is essentially asymptomatic. 3. DM2: COntinue healthy diet.  He also needs regular exercise. 4. Obesity: COntinue regular exercise.  Healthy diet.  He does try to stay active.  His job is somewhat physical.   Current medicines are reviewed at length with the patient today.  The patient concerns regarding his medicines were addressed.  The following changes have been made:  No change  Labs/ tests ordered today include:  No orders of the defined types were placed in this encounter.   Recommend 150 minutes/week of aerobic exercise Low fat, low  carb, high fiber diet recommended  Disposition:   FU in 1 year   Signed, Larae Grooms, MD  11/11/2018 2:55 PM    [facility] [address], [place], [place]  [address] Phone: [phone]; Fax: [phone]

## 2018-11-11 NOTE — Patient Instructions (Signed)

## 2019-07-27 ENCOUNTER — Other Ambulatory Visit: Payer: Self-pay | Admitting: Cardiology

## 2019-08-14 ENCOUNTER — Other Ambulatory Visit: Payer: Self-pay | Admitting: Cardiology

## 2019-08-15 ENCOUNTER — Other Ambulatory Visit: Payer: Self-pay

## 2019-08-15 MED ORDER — CLOPIDOGREL BISULFATE 75 MG PO TABS: 75.0000 mg | ORAL_TABLET | Freq: Every day | ORAL | 3 refills | Status: DC

## 2019-11-01 ENCOUNTER — Other Ambulatory Visit: Payer: Self-pay | Admitting: Cardiology

## 2019-11-10 ENCOUNTER — Other Ambulatory Visit: Payer: Self-pay | Admitting: Cardiology

## 2020-02-06 ENCOUNTER — Other Ambulatory Visit: Payer: Self-pay | Admitting: Cardiology

## 2020-03-19 ENCOUNTER — Other Ambulatory Visit: Payer: Self-pay | Admitting: Cardiology

## 2020-04-16 ENCOUNTER — Other Ambulatory Visit: Payer: Self-pay | Admitting: Cardiology

## 2020-07-08 ENCOUNTER — Other Ambulatory Visit: Payer: Self-pay | Admitting: Interventional Cardiology

## 2020-07-09 MED ORDER — LOSARTAN POTASSIUM 100 MG PO TABS: 50.0000 mg | ORAL_TABLET | Freq: Every day | ORAL | 0 refills | Status: DC

## 2020-07-09 MED ORDER — ATORVASTATIN CALCIUM 80 MG PO TABS: 80.0000 mg | ORAL_TABLET | Freq: Every day | ORAL | 0 refills | Status: DC

## 2020-07-09 MED ORDER — METOPROLOL TARTRATE 25 MG PO TABS: ORAL_TABLET | ORAL | 0 refills | Status: DC

## 2020-07-09 MED ORDER — CLOPIDOGREL BISULFATE 75 MG PO TABS: 75.0000 mg | ORAL_TABLET | Freq: Every day | ORAL | 0 refills | Status: DC

## 2020-07-09 NOTE — Addendum Note (Signed)
Addended by: Carter Kitten D on: 07/09/2020 12:02 PM   Modules accepted: Orders

## 2020-07-09 NOTE — Addendum Note (Signed)
Addended by: Carter Kitten D on: 07/09/2020 12:04 PM   Modules accepted: Orders

## 2020-07-09 NOTE — Telephone Encounter (Signed)
Pt's medication was sent to pt's pharmacy as requested. Confirmation received.  °

## 2020-07-13 ENCOUNTER — Other Ambulatory Visit: Payer: Self-pay

## 2020-07-13 ENCOUNTER — Ambulatory Visit (INDEPENDENT_AMBULATORY_CARE_PROVIDER_SITE_OTHER): Payer: Self-pay | Admitting: Interventional Cardiology

## 2020-07-13 ENCOUNTER — Encounter: Payer: Self-pay | Admitting: Interventional Cardiology

## 2020-07-13 VITALS — BP 132/82 | HR 111 | Ht 76.0 in | Wt 288.2 lb

## 2020-07-13 DIAGNOSIS — I252 Old myocardial infarction: Secondary | ICD-10-CM

## 2020-07-13 DIAGNOSIS — I10 Essential (primary) hypertension: Secondary | ICD-10-CM

## 2020-07-13 DIAGNOSIS — I25118 Atherosclerotic heart disease of native coronary artery with other forms of angina pectoris: Secondary | ICD-10-CM

## 2020-07-13 DIAGNOSIS — E118 Type 2 diabetes mellitus with unspecified complications: Secondary | ICD-10-CM

## 2020-07-13 DIAGNOSIS — Z794 Long term (current) use of insulin: Secondary | ICD-10-CM

## 2020-07-13 DIAGNOSIS — E782 Mixed hyperlipidemia: Secondary | ICD-10-CM

## 2020-07-13 DIAGNOSIS — I4892 Unspecified atrial flutter: Secondary | ICD-10-CM

## 2020-07-13 MED ORDER — METOPROLOL TARTRATE 25 MG PO TABS
25.0000 mg | ORAL_TABLET | Freq: Two times a day (BID) | ORAL | 3 refills | Status: DC
Start: 2020-07-13 — End: 2020-08-27

## 2020-07-13 NOTE — Patient Instructions (Signed)
Medication Instructions:  Your physician recommends that you continue on your current medications as directed. Please refer to the Current Medication list given to you today.  *If you need a refill on your cardiac medications before your next appointment, please call your pharmacy*   Lab Work: Your physician recommends that you return for a FASTING LIPIDS, CBC, TSH, A1C, and CMET  If you have labs (blood work) drawn today and your tests are completely normal, you will receive your results only by: Marland Kitchen MyChart Message (if you have MyChart) OR . A paper copy in the mail If you have any lab test that is abnormal or we need to change your treatment, we will call you to review the results.   Testing/Procedures: None   Follow-Up: At [facility], you and your health needs are our priority.  As part of our continuing mission to provide you with exceptional heart care, we have created designated Provider Care Teams.  These Care Teams include your primary Cardiologist (physician) and Advanced Practice Providers (APPs -  Physician Assistants and Nurse Practitioners) who all work together to provide you with the care you need, when you need it.  We recommend signing up for the patient portal called "MyChart".  Sign up information is provided on this After Visit Summary.  MyChart is used to connect with patients for Virtual Visits (Telemedicine).  Patients are able to view lab/test results, encounter notes, upcoming appointments, etc.  Non-urgent messages can be sent to your provider as well.   To learn more about what you can do with MyChart, go to [email or web address].    Your next appointment:   12 month(s)  The format for your next appointment:   In Person  Provider:   You may see Larae Grooms, MD or one of the following Advanced Practice Providers on your designated Care Team:    Melina Copa, PA-C  Ermalinda Barrios, PA-C    Other Instructions Pfizer and Moderna Covid vaccines  are recommended   High-Fiber Diet Fiber, also called dietary fiber, is a type of carbohydrate that is found in fruits, vegetables, whole grains, and beans. A high-fiber diet can have many health benefits. Your health care provider may recommend a high-fiber diet to help:  Prevent constipation. Fiber can make your bowel movements more regular.  Lower your cholesterol.  Relieve the following conditions: ? Swelling of veins in the anus (hemorrhoids). ? Swelling and irritation (inflammation) of specific areas of the digestive tract (uncomplicated diverticulosis). ? A problem of the large intestine (colon) that sometimes causes pain and diarrhea (irritable bowel syndrome, IBS).  Prevent overeating as part of a weight-loss plan.  Prevent heart disease, type 2 diabetes, and certain cancers. What is my plan? The recommended daily fiber intake in grams (g) includes:  38 g for men age 33 or younger.  30 g for men over age 50.  49 g for women age 61 or younger.  21 g for women over age 60. You can get the recommended daily intake of dietary fiber by:  Eating a variety of fruits, vegetables, grains, and beans.  Taking a fiber supplement, if it is not possible to get enough fiber through your diet. What do I need to know about a high-fiber diet?  It is better to get fiber through food sources rather than from fiber supplements. There is not a lot of research about how effective supplements are.  Always check the fiber content on the nutrition facts label of any prepackaged food. Look  for foods that contain 5 g of fiber or more per serving.  Talk with a diet and nutrition specialist (dietitian) if you have questions about specific foods that are recommended or not recommended for your medical condition, especially if those foods are not listed below.  Gradually increase how much fiber you consume. If you increase your intake of dietary fiber too quickly, you may have bloating, cramping, or  gas.  Drink plenty of water. Water helps you to digest fiber. What are tips for following this plan?  Eat a wide variety of high-fiber foods.  Make sure that half of the grains that you eat each day are whole grains.  Eat breads and cereals that are made with whole-grain flour instead of refined flour or white flour.  Eat brown rice, bulgur wheat, or millet instead of white rice.  Start the day with a breakfast that is high in fiber, such as a cereal that contains 5 g of fiber or more per serving.  Use beans in place of meat in soups, salads, and pasta dishes.  Eat high-fiber snacks, such as berries, raw vegetables, nuts, and popcorn.  Choose whole fruits and vegetables instead of processed forms like juice or sauce. What foods can I eat?  Fruits Berries. Pears. Apples. Oranges. Avocado. Prunes and raisins. Dried figs. Vegetables Sweet potatoes. Spinach. Kale. Artichokes. Cabbage. Broccoli. Cauliflower. Green peas. Carrots. Squash. Grains Whole-grain breads. Multigrain cereal. Oats and oatmeal. Brown rice. Barley. Bulgur wheat. [place]. Quinoa. Bran muffins. Popcorn. Rye wafer crackers. Meats and other proteins Navy, kidney, and pinto beans. Soybeans. Split peas. Lentils. Nuts and seeds. Dairy Fiber-fortified yogurt. Beverages Fiber-fortified soy milk. Fiber-fortified orange juice. Other foods Fiber bars. The items listed above may not be a complete list of recommended foods and beverages. Contact a dietitian for more options. What foods are not recommended? Fruits Fruit juice. Cooked, strained fruit. Vegetables Fried potatoes. Canned vegetables. Well-cooked vegetables. Grains White bread. Pasta made with refined flour. White rice. Meats and other proteins Fatty cuts of meat. Fried chicken or fried fish. Dairy Milk. Yogurt. Cream cheese. Sour cream. Fats and oils Butters. Beverages Soft drinks. Other foods Cakes and pastries. The items listed above may not be a  complete list of foods and beverages to avoid. Contact a dietitian for more information. Summary  Fiber is a type of carbohydrate. It is found in fruits, vegetables, whole grains, and beans.  There are many health benefits of eating a high-fiber diet, such as preventing constipation, lowering blood cholesterol, helping with weight loss, and reducing your risk of heart disease, diabetes, and certain cancers.  Gradually increase your intake of fiber. Increasing too fast can result in cramping, bloating, and gas. Drink plenty of water while you increase your fiber.  The best sources of fiber include whole fruits and vegetables, whole grains, nuts, seeds, and beans. This information is not intended to replace advice given to you by your health care provider. Make sure you discuss any questions you have with your health care provider. Document Revised: 09/21/2017 Document Reviewed: 09/21/2017 Elsevier Patient Education  2020 [employer].

## 2020-07-13 NOTE — Progress Notes (Signed)
Cardiology Office Note   Date:  07/13/2020   ID:  Derek Herrera, Derek Herrera [birth year], MRN [id number]  PCP:  Leonard Downing, MD    No chief complaint on file.  CAD  Wt Readings from Last 3 Encounters:  07/13/20 288 lb 3.2 oz (130.7 kg)  11/11/18 277 lb 12.8 oz (126 kg)  10/13/18 274 lb 1.9 oz (124.3 kg)       History of Present Illness: Derek Herrera is a 59 y.o. male  with history of CAD status post STEMI 01/2017 treated with DES to the proximal RCA, LVEF 45% with chronic systolic CHF, hypertension, HLD, DM. Remote history of atrial fibrillation with cardioversion 7 years ago had been off anticoagulation.  Patient had not followed up regularly and was seen by Ellen Henri, PA-C 07/22/2018 at which time he was in atrial flutter with variable AV block ventricular rate 72 bpm.  He was changed to Eliquis and Plavix, no aspirin.  He was supposed to have , but this did not happen.    He has had cardioversion years ago, TEE/CV in 2012 (Bensimhon); and DCCV in 2013 (Dr. Harrington Challenger).   Recurrent atrial flutter in 2019 treatment with cardioversion was delayed due to his lack of insurance.  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    Denies : Chest pain. Dizziness. Leg edema. Nitroglycerin use. Orthopnea. Palpitations. Paroxysmal nocturnal dyspnea. Shortness of breath. Syncope.     Past Medical History:  Diagnosis Date  . Anemia   . Clotting disorder ([place])   . DIABETES MELLITUS, TYPE I 06/29/2007  . DM nephropathy/sclerosis   . HYPERCHOLESTEROLEMIA 05/20/2010  . HYPERTENSION 06/29/2007  . Lytic lesion of bone on x-ray 09/29/2012   Expansile lesion Left lateral rib on CT 09/23/12  Chest otherwise negative  . Obstructive sleep apnea    moderate by sleep study 11/13  . Persistent atrial fibrillation [facility])     Past Surgical History:  Procedure Laterality Date  . CARDIOVERSION  09/03/2012   Procedure: CARDIOVERSION;  Surgeon: Fay Records, MD;  Location:  [facility] ENDOSCOPY;  Service: Cardiovascular;  Laterality: N/A;  left message with leb RN to clarify if mcalhaney is doing this procedure/dl/10 2  . CORONARY/GRAFT ACUTE MI REVASCULARIZATION N/A 02/15/2017   Procedure: Coronary/Graft Acute MI Revascularization;  Surgeon: Jettie Booze, MD;  Location: [place] CV LAB;  Service: Cardiovascular;  Laterality: N/A;  . KNEE SURGERY     x's 3  . LEFT HEART CATH AND CORONARY ANGIOGRAPHY N/A 02/15/2017   Procedure: Left Heart Cath and Coronary Angiography;  Surgeon: Jettie Booze, MD;  Location: [place] CV LAB;  Service: Cardiovascular;  Laterality: N/A;  . TEE WITH CARDIOVERSION  08/02/2011   EF of approximately 50-55% conversion to sinus rhythm.  There were no apparent complications  . WRIST FRACTURE SURGERY  right     Current Outpatient Medications  Medication Sig Dispense Refill  . acetaminophen (TYLENOL) 325 MG tablet Take 2 tablets (650 mg total) by mouth every 4 (four) hours as needed for headache or mild pain.    Marland Kitchen atorvastatin (LIPITOR) 80 MG tablet Take 1 tablet (80 mg total) by mouth daily. Please make overdue appt with Dr. Irish Lack before anymore refills. 1st attempt 30 tablet 0  . clopidogrel (PLAVIX) 75 MG tablet Take 1 tablet (75 mg total) by mouth daily. Please make overdue appt with Dr. Irish Lack before anymore refills. 1st attempt 30 tablet 0  . insulin aspart (NOVOLOG) 100  UNIT/ML injection Inject 45 Units into the skin 3 (three) times daily before meals. 45-95 UNITS AS NEEDED    . losartan (COZAAR) 100 MG tablet Take 0.5 tablets (50 mg total) by mouth daily. Please make overdue appt with Dr. Irish Lack before anymore refills. 1st attempt. 15 tablet 0  . metoprolol tartrate (LOPRESSOR) 25 MG tablet Take 1/2 (one-half) tablet by mouth twice daily. Please make overdue appt with Dr. Irish Lack before anymore refills. 1st attempt 60 tablet 0  . nitroGLYCERIN (NITROSTAT) 0.4 MG SL tablet Place 1 tablet (0.4 mg total) under the tongue  every 5 (five) minutes as needed for chest pain. 25 tablet 3   No current facility-administered medications for this visit.    Allergies:   Patient has no known allergies.    Social History:  The patient  reports that he quit smoking about 29 years ago. His smoking use included cigarettes. He has a 20.00 pack-year smoking history. He has quit using smokeless tobacco.  His smokeless tobacco use included chew and snuff. He reports that he does not drink alcohol and does not use drugs.   Family History:  The patient's family history includes Bone cancer in his maternal grandmother; Cancer in his paternal grandfather; Diabetes in his maternal grandmother.    ROS:  Please see the history of present illness.   Otherwise, review of systems are positive for weight gain.   All other systems are reviewed and negative.    PHYSICAL EXAM: VS:  BP 132/82   Pulse (!) 111   Ht 6\' 4"  (1.93 m)   Wt 288 lb 3.2 oz (130.7 kg)   SpO2 98%   BMI 35.08 kg/m  , BMI Body mass index is 35.08 kg/m. GEN: Well nourished, well developed, in no acute distress  HEENT: normal  Neck: no JVD, carotid bruits, or masses Cardiac: irregularly irregular; no murmurs, rubs, or gallops,no edema  Respiratory:  clear to auscultation bilaterally, normal work of breathing GI: soft, nontender, nondistended, + BS MS: no deformity or atrophy  Skin: warm and dry, no rash Neuro:  Strength and sensation are intact Psych: euthymic mood, full affect   EKG:   The ekg ordered today demonstrates AFib with RVR   Recent Labs: No results found for requested labs within last 8760 hours.   Lipid Panel    Component Value Date/Time   CHOL 102 07/23/2018 0818   TRIG 54 07/23/2018 0818   HDL 44 07/23/2018 0818   CHOLHDL 2.3 07/23/2018 0818   CHOLHDL 5.0 02/16/2017 0329   VLDL 23 02/16/2017 0329   LDLCALC 47 07/23/2018 0818     Other studies Reviewed: Additional studies/ records that were reviewed today with results  demonstrating: Prior labs and records reviewed..   ASSESSMENT AND PLAN:  1. 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 COntinue aggressive secondary prevention.  Doing well several years after PCI of the right coronary artery. 2. Atrial flutter: Noted in the past.  Now in AFib with RVR.  Discussed anticoagulation.  Took Xarelto in the past.  Increase metoprolol to 25 mg BID.  We will see if he can obtain Eliquis.  It may be too expensive.  If it is too expensive, we will stick to clopidogrel.  Could also see if he changes his mind about warfarin. 3. DM2: A1C 7.4 in 2019.  Whole food, plant based diet. 4. Obesity: Trying to stick to a healthy diet.  5. Hyperlipidemia: LDL 47 in 2019.  Continue lipid-lowering therapy. 6. Hypertension: The current medical regimen is  effective;  continue present plan and medications. 7. Check labs when he is fasting.   Current medicines are reviewed at length with the patient today.  The patient concerns regarding his medicines were addressed.  The following changes have been made:  Increase metoprolol.   Labs/ tests ordered today include:  No orders of the defined types were placed in this encounter.   Recommend 150 minutes/week of aerobic exercise Low fat, low carb, high fiber diet recommended  Disposition:   FU in 1 year   Signed, Larae Grooms, MD  07/13/2020 4:40 PM    [facility] [place], [place], [place]  [address] Phone: [phone]; Fax: [phone]

## 2020-07-16 ENCOUNTER — Other Ambulatory Visit: Payer: Self-pay

## 2020-07-16 ENCOUNTER — Other Ambulatory Visit: Payer: Self-pay | Admitting: *Deleted

## 2020-07-16 DIAGNOSIS — E118 Type 2 diabetes mellitus with unspecified complications: Secondary | ICD-10-CM

## 2020-07-16 DIAGNOSIS — Z794 Long term (current) use of insulin: Secondary | ICD-10-CM

## 2020-07-16 DIAGNOSIS — I10 Essential (primary) hypertension: Secondary | ICD-10-CM

## 2020-07-16 DIAGNOSIS — I25118 Atherosclerotic heart disease of native coronary artery with other forms of angina pectoris: Secondary | ICD-10-CM

## 2020-07-16 DIAGNOSIS — I252 Old myocardial infarction: Secondary | ICD-10-CM

## 2020-07-16 DIAGNOSIS — E782 Mixed hyperlipidemia: Secondary | ICD-10-CM

## 2020-07-16 DIAGNOSIS — I4892 Unspecified atrial flutter: Secondary | ICD-10-CM

## 2020-07-16 LAB — LIPID PANEL
Chol/HDL Ratio: 3.1 ratio (ref 0.0–5.0)
Cholesterol, Total: 151 mg/dL (ref 100–199)
HDL: 48 mg/dL (ref >39)
LDL Chol Calc (NIH): 91 mg/dL (ref 0–99)
Triglycerides: 57 mg/dL (ref 0–149)
VLDL Cholesterol Cal: 12 mg/dL (ref 5–40)

## 2020-07-16 LAB — CBC
Hematocrit: 50.1 % (ref 37.5–51.0)
Hemoglobin: 16.1 g/dL (ref 13.0–17.7)
MCH: 28.5 pg (ref 26.6–33.0)
MCHC: 32.1 g/dL (ref 31.5–35.7)
MCV: 89 fL (ref 79–97)
Platelets: 215 10*3/uL (ref 150–450)
RBC: 5.65 x10E6/uL (ref 4.14–5.80)
RDW: 13.1 % (ref 11.6–15.4)
WBC: 7.7 10*3/uL (ref 3.4–10.8)

## 2020-07-16 LAB — COMPREHENSIVE METABOLIC PANEL
ALT: 20 IU/L (ref 0–44)
AST: 21 IU/L (ref 0–40)
Albumin/Globulin Ratio: 1.5 (ref 1.2–2.2)
Albumin: 3.8 g/dL (ref 3.8–4.9)
Alkaline Phosphatase: 84 IU/L (ref 48–121)
BUN/Creatinine Ratio: 16 (ref 9–20)
BUN: 16 mg/dL (ref 6–24)
Bilirubin Total: 0.5 mg/dL (ref 0.0–1.2)
CO2: 26 mmol/L (ref 20–29)
Calcium: 8.9 mg/dL (ref 8.7–10.2)
Chloride: 104 mmol/L (ref 96–106)
Creatinine, Ser: 0.98 mg/dL (ref 0.76–1.27)
GFR calc Af Amer: 97 mL/min/{1.73_m2} (ref >59)
GFR calc non Af Amer: 84 mL/min/{1.73_m2} (ref >59)
Globulin, Total: 2.5 g/dL (ref 1.5–4.5)
Glucose: 176 mg/dL — ABNORMAL HIGH (ref 65–99)
Potassium: 4.7 mmol/L (ref 3.5–5.2)
Sodium: 140 mmol/L (ref 134–144)
Total Protein: 6.3 g/dL (ref 6.0–8.5)

## 2020-07-16 LAB — TSH: TSH: 4.18 u[IU]/mL (ref 0.450–4.500)

## 2020-07-16 LAB — HEMOGLOBIN A1C
Est. average glucose Bld gHb Est-mCnc: 143 mg/dL
Hgb A1c MFr Bld: 6.6 % — ABNORMAL HIGH (ref 4.8–5.6)

## 2020-07-17 ENCOUNTER — Telehealth: Payer: Self-pay | Admitting: Interventional Cardiology

## 2020-07-17 DIAGNOSIS — E782 Mixed hyperlipidemia: Secondary | ICD-10-CM

## 2020-07-17 DIAGNOSIS — I25118 Atherosclerotic heart disease of native coronary artery with other forms of angina pectoris: Secondary | ICD-10-CM

## 2020-07-17 NOTE — Telephone Encounter (Signed)
Patient is returning call regarding results and medication. He states he has not taken Atorvastatin in 1.5 years.

## 2020-07-17 NOTE — Telephone Encounter (Signed)
    Pt called again, he said [place] to leave him a detailed message about his result in case he unable to answer call

## 2020-07-17 NOTE — Telephone Encounter (Signed)
Cleon Gustin, RN  07/17/2020 2:46 PM EDT Back to Top    Left detailed message of results on VM (DPR on file). Instructed for patient to call back to let us know if he has been taking his atorvastatin regularly.   Cleon Gustin, RN  07/17/2020 1:57 PM EDT     Left message for patient to call back.   Jettie Booze, MD  07/16/2020 11:27 PM EDT     A1C improved from prior. Other labs normal except pr LDL that has increased since 2019.  Is he taking atorvastatin regularly?

## 2020-07-17 NOTE — Telephone Encounter (Signed)
Called and spoke to patient. He states that he has not taken the atorvastatin in 1.5 years. He states that he got a refill at his visit the other day and has since restarted. I have rescheduled the patient for repeat labs in 3 months on 11/17 and instructed the patient to take the atorvastatin daily as prescribed. He verbalized understanding and thanked me for the call.

## 2020-07-17 NOTE — Telephone Encounter (Signed)
Patient returning Brittany's call in regards to lab results. You can leave detailed VM.

## 2020-08-15 ENCOUNTER — Other Ambulatory Visit: Payer: Self-pay | Admitting: Interventional Cardiology

## 2020-08-27 ENCOUNTER — Other Ambulatory Visit: Payer: Self-pay | Admitting: Interventional Cardiology

## 2020-10-09 ENCOUNTER — Other Ambulatory Visit: Payer: Self-pay | Admitting: Interventional Cardiology

## 2020-10-17 ENCOUNTER — Other Ambulatory Visit: Payer: Self-pay

## 2020-10-18 ENCOUNTER — Other Ambulatory Visit: Payer: Self-pay | Admitting: *Deleted

## 2020-10-18 ENCOUNTER — Other Ambulatory Visit: Payer: Self-pay

## 2020-10-18 DIAGNOSIS — I25118 Atherosclerotic heart disease of native coronary artery with other forms of angina pectoris: Secondary | ICD-10-CM

## 2020-10-18 DIAGNOSIS — E782 Mixed hyperlipidemia: Secondary | ICD-10-CM

## 2020-10-18 LAB — HEPATIC FUNCTION PANEL
ALT: 22 IU/L (ref 0–44)
AST: 18 IU/L (ref 0–40)
Albumin: 3.8 g/dL (ref 3.8–4.9)
Alkaline Phosphatase: 104 IU/L (ref 44–121)
Bilirubin Total: 0.4 mg/dL (ref 0.0–1.2)
Bilirubin, Direct: 0.19 mg/dL (ref 0.00–0.40)
Total Protein: 6.2 g/dL (ref 6.0–8.5)

## 2020-10-18 LAB — LIPID PANEL
Chol/HDL Ratio: 2.4 ratio (ref 0.0–5.0)
Cholesterol, Total: 119 mg/dL (ref 100–199)
HDL: 49 mg/dL (ref >39)
LDL Chol Calc (NIH): 57 mg/dL (ref 0–99)
Triglycerides: 58 mg/dL (ref 0–149)
VLDL Cholesterol Cal: 13 mg/dL (ref 5–40)

## 2021-01-15 ENCOUNTER — Other Ambulatory Visit: Payer: Self-pay | Admitting: Interventional Cardiology

## 2021-02-27 ENCOUNTER — Other Ambulatory Visit: Payer: Self-pay | Admitting: Interventional Cardiology

## 2021-04-12 ENCOUNTER — Other Ambulatory Visit: Payer: Self-pay | Admitting: Interventional Cardiology

## 2021-05-29 ENCOUNTER — Other Ambulatory Visit: Payer: Self-pay | Admitting: Interventional Cardiology

## 2021-05-30 ENCOUNTER — Other Ambulatory Visit: Payer: Self-pay | Admitting: Interventional Cardiology

## 2021-07-22 ENCOUNTER — Telehealth: Payer: Self-pay | Admitting: Interventional Cardiology

## 2021-07-22 NOTE — Telephone Encounter (Signed)
*  STAT* If patient is at the pharmacy, call can be transferred to refill team.   1. Which medications need to be refilled? (please list name of each medication and dose if known) patient need all heart medication patient did not know the names of them  2. Which pharmacy/location (including street and city if local pharmacy) is medication to be sent to? [place]  3. Do they need a 30 day or 90 day supply? Not sure  Patient is out of medications no refills

## 2021-07-23 ENCOUNTER — Other Ambulatory Visit: Payer: Self-pay | Admitting: *Deleted

## 2021-07-23 MED ORDER — METOPROLOL TARTRATE 25 MG PO TABS
ORAL_TABLET | ORAL | 1 refills | Status: DC
Start: 2021-07-23 — End: 2022-01-22

## 2021-07-23 MED ORDER — LOSARTAN POTASSIUM 100 MG PO TABS
50.0000 mg | ORAL_TABLET | Freq: Every day | ORAL | 1 refills | Status: DC
Start: 2021-07-23 — End: 2022-01-22

## 2021-07-23 MED ORDER — CLOPIDOGREL BISULFATE 75 MG PO TABS
75.0000 mg | ORAL_TABLET | Freq: Every day | ORAL | 1 refills | Status: DC
Start: 2021-07-23 — End: 2022-01-22

## 2021-07-23 NOTE — Telephone Encounter (Signed)
Lvm for pt to call office to see what refills pt needed.  Pt also needs a ov. For #90 supply on refills.

## 2021-07-23 NOTE — Telephone Encounter (Signed)
Patient returned call to the office and he needs clopidogrel, metoprolol tartrate and losartan refilled. He is aware that I will send these in as requested with refills to last until his January 2023 scheduled appointment.

## 2021-08-12 ENCOUNTER — Other Ambulatory Visit: Payer: Self-pay | Admitting: Interventional Cardiology

## 2021-08-12 NOTE — Telephone Encounter (Signed)
Pt's medication was sent to pt's pharmacy as requested. Confirmation received.  °

## 2021-12-13 ENCOUNTER — Other Ambulatory Visit: Payer: Self-pay

## 2021-12-13 ENCOUNTER — Ambulatory Visit (INDEPENDENT_AMBULATORY_CARE_PROVIDER_SITE_OTHER): Payer: Self-pay | Admitting: Interventional Cardiology

## 2021-12-13 ENCOUNTER — Encounter: Payer: Self-pay | Admitting: Interventional Cardiology

## 2021-12-13 ENCOUNTER — Telehealth: Payer: Self-pay | Admitting: *Deleted

## 2021-12-13 VITALS — BP 136/76 | HR 90 | Ht 73.0 in | Wt 282.0 lb

## 2021-12-13 DIAGNOSIS — E118 Type 2 diabetes mellitus with unspecified complications: Secondary | ICD-10-CM

## 2021-12-13 DIAGNOSIS — Z794 Long term (current) use of insulin: Secondary | ICD-10-CM

## 2021-12-13 DIAGNOSIS — I4892 Unspecified atrial flutter: Secondary | ICD-10-CM

## 2021-12-13 DIAGNOSIS — I252 Old myocardial infarction: Secondary | ICD-10-CM

## 2021-12-13 DIAGNOSIS — I25118 Atherosclerotic heart disease of native coronary artery with other forms of angina pectoris: Secondary | ICD-10-CM

## 2021-12-13 DIAGNOSIS — E782 Mixed hyperlipidemia: Secondary | ICD-10-CM

## 2021-12-13 DIAGNOSIS — I10 Essential (primary) hypertension: Secondary | ICD-10-CM

## 2021-12-13 NOTE — Progress Notes (Signed)
Cardiology Office Note   Date:  12/13/2021   ID:  Herrera, Derek 1961/06/17, MRN [id number]  PCP:  Kaleen Mask, MD    Chief Complaint  Patient presents with   Follow-up   CAD  Wt Readings from Last 3 Encounters:  12/13/21 282 lb (127.9 kg)  07/13/20 288 lb 3.2 oz (130.7 kg)  11/11/18 277 lb 12.8 oz (126 kg)       History of Present Illness: Derek Herrera is a 61 y.o. male   with history of CAD status post STEMI 01/2017 treated with DES to the proximal RCA, LVEF 45% with chronic systolic CHF, hypertension, HLD, DM.  History of atrial fibrillation with cardioversion in 2012.   Patient had not followed up regularly and was seen by Boyce Medici, PA-C 07/22/2018 at which time he was in atrial flutter with variable AV block ventricular rate 72 bpm.  He was changed to Eliquis and Plavix, no aspirin.  He was supposed to have , but this did not happen.     He has had cardioversion years ago, TEE/CV in 2012 (Bensimhon); and DCCV in 2013 (Dr. Tenny Craw).    Recurrent atrial flutter in 2019 treatment with cardioversion was delayed due to his lack of insurance.   Was back in AFib in 2021.  ELiquis was recommended but cost was an issue.    Stays busy pouring concrete.   Denies : Chest pain. Dizziness. Leg edema. Nitroglycerin use. Orthopnea. Palpitations. Paroxysmal nocturnal dyspnea. Shortness of breath. Syncope.    Careful to avoid working in high temp.  Past Medical History:  Diagnosis Date   Anemia    Clotting disorder ([facility])    DIABETES MELLITUS, TYPE I 06/29/2007   DM nephropathy/sclerosis    HYPERCHOLESTEROLEMIA 05/20/2010   HYPERTENSION 06/29/2007   Lytic lesion of bone on x-ray 09/29/2012   Expansile lesion Left lateral rib on CT 09/23/12  Chest otherwise negative   Obstructive sleep apnea    moderate by sleep study 11/13   Persistent atrial fibrillation [facility])     Past Surgical History:  Procedure Laterality Date   CARDIOVERSION  09/03/2012    Procedure: CARDIOVERSION;  Surgeon: Pricilla Riffle, MD;  Location[facility] ENDOSCOPY;  Service: Cardiovascular;  Laterality: N/A;  left message with leb RN to clarify if mcalhaney is doing this procedure/dl/10 2   CORONARY/GRAFT ACUTE MI REVASCULARIZATION N/A 02/15/2017   Procedure: Coronary/Graft Acute MI Revascularization;  Surgeon: Corky Crafts, MD;  Location: [facility] INVASIVE CV LAB;  Service: Cardiovascular;  Laterality: N/A;   KNEE SURGERY     x's 3   LEFT HEART CATH AND CORONARY ANGIOGRAPHY N/A 02/15/2017   Procedure: Left Heart Cath and Coronary Angiography;  Surgeon: Corky Crafts, MD;  Location: [facility] INVASIVE CV LAB;  Service: Cardiovascular;  Laterality: N/A;   TEE WITH CARDIOVERSION  08/02/2011   EF of approximately 50-55% conversion to sinus rhythm.  There were no apparent complications   WRIST FRACTURE SURGERY  right     Current Outpatient Medications  Medication Sig Dispense Refill   acetaminophen (TYLENOL) 325 MG tablet Take 2 tablets (650 mg total) by mouth every 4 (four) hours as needed for headache or mild pain.     atorvastatin (LIPITOR) 80 MG tablet Take 1 tablet by mouth once daily 90 tablet 1   clopidogrel (PLAVIX) 75 MG tablet Take 1 tablet (75 mg total) by mouth daily. 90 tablet 1   insulin aspart (NOVOLOG) 100 UNIT/ML injection Inject 45 Units  into the skin 3 (three) times daily before meals. 45-95 UNITS AS NEEDED     losartan (COZAAR) 100 MG tablet Take 0.5 tablets (50 mg total) by mouth daily. 45 tablet 1   metoprolol tartrate (LOPRESSOR) 25 MG tablet TAKE 0.5 TABLETS BY MOUTH 2 TIMES DAILY. 90 tablet 1   nitroGLYCERIN (NITROSTAT) 0.4 MG SL tablet Place 1 tablet (0.4 mg total) under the tongue every 5 (five) minutes as needed for chest pain. 25 tablet 3   No current facility-administered medications for this visit.    Allergies:   Patient has no known allergies.    Social History:  The patient  reports that he quit smoking about 31 years ago. His smoking use  included cigarettes. He has a 20.00 pack-year smoking history. He has quit using smokeless tobacco.  His smokeless tobacco use included chew and snuff. He reports that he does not drink alcohol and does not use drugs.   Family History:  The patient's family history includes Bone cancer in his maternal grandmother; Cancer in his paternal grandfather; Diabetes in his maternal grandmother.    ROS:  Please see the history of present illness.   Otherwise, review of systems are positive for weight gain over the holidays.   All other systems are reviewed and negative.    PHYSICAL EXAM: VS:  BP 136/76    Pulse 90    Ht $R'6\' 1"'mw$  (1.854 m)    Wt 282 lb (127.9 kg)    SpO2 99%    BMI 37.21 kg/m  , BMI Body mass index is 37.21 kg/m. GEN: Well nourished, well developed, in no acute distress HEENT: normal Neck: no JVD, carotid bruits, or masses Cardiac: irregularly irregular; no murmurs, rubs, or gallops,no edema  Respiratory:  clear to auscultation bilaterally, normal work of breathing GI: soft, nontender, nondistended, + BS MS: no deformity or atrophy Skin: warm and dry, no rash Neuro:  Strength and sensation are intact Psych: euthymic mood, full affect   EKG:   The ekg ordered today demonstrates AFib, rate controlled.    Recent Labs: No results found for requested labs within last 8760 hours.   Lipid Panel    Component Value Date/Time   CHOL 119 10/18/2020 0724   TRIG 58 10/18/2020 0724   HDL 49 10/18/2020 0724   CHOLHDL 2.4 10/18/2020 0724   CHOLHDL 5.0 02/16/2017 0329   VLDL 23 02/16/2017 0329   LDLCALC 57 10/18/2020 0724     Other studies Reviewed: Additional studies/ records that were reviewed today with results demonstrating: labs reviewed.   ASSESSMENT AND PLAN:  CAD/Old MI: No angina. Continue continue aggressive secondary prevention.  Atrial flutter: AFib, rate controlled today.  Concerned about cost of Eliquis.  He prefers to stay on clopidogrel. Will check to see if he  would be eligible for any programs for anticoagulation.  DM2: A1C 6.6.  Whole food, plant based diet.  Regulates insulin based on finger stick. Morbid Obesity: Diet worsened over the holidays. He got down below 270, but it went back up.   Hyperlipidemia: LDL 57 in 10/2020.   HTN: The current medical regimen is effective;  continue present plan and medications. Stress from divorcing his wife.  Check CBC, c-Met, lipids, A1c.  He has not had any labs since 2021.   Current medicines are reviewed at length with the patient today.  The patient concerns regarding his medicines were addressed.  The following changes have been made:  No change  Labs/ tests ordered today  include:  No orders of the defined types were placed in this encounter.   Recommend 150 minutes/week of aerobic exercise Low fat, low carb, high fiber diet recommended  Disposition:   FU in 1 year   Signed, Larae Grooms, MD  12/13/2021 3:27 PM    [facility] [place], [place], [place]  [address] Phone: [phone]; Fax: [phone]

## 2021-12-13 NOTE — Telephone Encounter (Signed)
Dr Irish Lack would like patient to have CMET, CBC, Lipid profile and A1C I placed call to patient and left message to call office

## 2021-12-13 NOTE — Patient Instructions (Signed)

## 2022-01-13 NOTE — Telephone Encounter (Signed)
Left message to call office

## 2022-01-20 NOTE — Telephone Encounter (Signed)
Left message to call office

## 2022-01-22 ENCOUNTER — Other Ambulatory Visit: Payer: Self-pay | Admitting: Interventional Cardiology

## 2022-01-22 ENCOUNTER — Encounter: Payer: Self-pay | Admitting: *Deleted

## 2022-01-22 MED ORDER — NITROGLYCERIN 0.4 MG SL SUBL: 0.4000 mg | SUBLINGUAL_TABLET | SUBLINGUAL | 3 refills | Status: DC | PRN

## 2022-01-22 NOTE — Addendum Note (Signed)
Addended by: Thompson Grayer on: 01/22/2022 02:37 PM   Modules accepted: Orders

## 2022-01-22 NOTE — Telephone Encounter (Signed)
Letter sent to patient asking him to contact office to schedule lab appointment

## 2022-01-22 NOTE — Telephone Encounter (Signed)
I spoke with patient.  He will come in for fasting lab work on January 30, 2022

## 2022-01-30 ENCOUNTER — Other Ambulatory Visit: Payer: Self-pay

## 2022-01-30 DIAGNOSIS — E118 Type 2 diabetes mellitus with unspecified complications: Secondary | ICD-10-CM

## 2022-01-30 DIAGNOSIS — E782 Mixed hyperlipidemia: Secondary | ICD-10-CM

## 2022-01-30 DIAGNOSIS — I25118 Atherosclerotic heart disease of native coronary artery with other forms of angina pectoris: Secondary | ICD-10-CM

## 2022-01-30 DIAGNOSIS — I10 Essential (primary) hypertension: Secondary | ICD-10-CM

## 2022-01-30 DIAGNOSIS — Z794 Long term (current) use of insulin: Secondary | ICD-10-CM

## 2022-01-31 LAB — LIPID PANEL
Chol/HDL Ratio: 2.4 ratio (ref 0.0–5.0)
Cholesterol, Total: 119 mg/dL (ref 100–199)
HDL: 49 mg/dL (ref >39)
LDL Chol Calc (NIH): 58 mg/dL (ref 0–99)
Triglycerides: 49 mg/dL (ref 0–149)
VLDL Cholesterol Cal: 12 mg/dL (ref 5–40)

## 2022-01-31 LAB — COMPREHENSIVE METABOLIC PANEL
ALT: 23 IU/L (ref 0–44)
AST: 22 IU/L (ref 0–40)
Albumin/Globulin Ratio: 1.7 (ref 1.2–2.2)
Albumin: 4 g/dL (ref 3.8–4.8)
Alkaline Phosphatase: 93 IU/L (ref 44–121)
BUN/Creatinine Ratio: 16 (ref 10–24)
BUN: 14 mg/dL (ref 8–27)
Bilirubin Total: 0.4 mg/dL (ref 0.0–1.2)
CO2: 24 mmol/L (ref 20–29)
Calcium: 9 mg/dL (ref 8.6–10.2)
Chloride: 105 mmol/L (ref 96–106)
Creatinine, Ser: 0.9 mg/dL (ref 0.76–1.27)
Globulin, Total: 2.4 g/dL (ref 1.5–4.5)
Glucose: 127 mg/dL — ABNORMAL HIGH (ref 70–99)
Potassium: 4.9 mmol/L (ref 3.5–5.2)
Sodium: 140 mmol/L (ref 134–144)
Total Protein: 6.4 g/dL (ref 6.0–8.5)
eGFR: 97 mL/min/{1.73_m2} (ref >59)

## 2022-01-31 LAB — CBC
Hematocrit: 45.5 % (ref 37.5–51.0)
Hemoglobin: 15.4 g/dL (ref 13.0–17.7)
MCH: 30 pg (ref 26.6–33.0)
MCHC: 33.8 g/dL (ref 31.5–35.7)
MCV: 89 fL (ref 79–97)
Platelets: 186 10*3/uL (ref 150–450)
RBC: 5.13 x10E6/uL (ref 4.14–5.80)
RDW: 12.4 % (ref 11.6–15.4)
WBC: 6.6 10*3/uL (ref 3.4–10.8)

## 2022-01-31 LAB — HEMOGLOBIN A1C
Est. average glucose Bld gHb Est-mCnc: 163 mg/dL
Hgb A1c MFr Bld: 7.3 % — ABNORMAL HIGH (ref 4.8–5.6)

## 2022-02-14 ENCOUNTER — Telehealth: Payer: Self-pay | Admitting: Interventional Cardiology

## 2022-02-14 NOTE — Telephone Encounter (Signed)
Patient is returning call to discuss lab results. 

## 2022-02-14 NOTE — Telephone Encounter (Signed)
-----   Message from Jettie Booze, MD sent at 02/03/2022  8:24 AM EST ----- ?CBC, electrolytes, liver function tests, lipids all well controlled.  A1c 7.3 which is slightly above target.  Please CC his primary care doctor. ?

## 2022-02-14 NOTE — Telephone Encounter (Signed)
I spoke with patient and reviewed results with him.  Results faxed to Dr Arelia Sneddon ?

## 2023-01-19 ENCOUNTER — Telehealth: Payer: Self-pay | Admitting: Interventional Cardiology

## 2023-01-19 ENCOUNTER — Other Ambulatory Visit: Payer: Self-pay | Admitting: Interventional Cardiology

## 2023-01-19 MED ORDER — METOPROLOL TARTRATE 25 MG PO TABS: ORAL_TABLET | ORAL | 0 refills | Status: DC

## 2023-01-19 NOTE — Telephone Encounter (Signed)
*  STAT* If patient is at the pharmacy, call can be transferred to refill team.   1. Which medications need to be refilled? (please list name of each medication and dose if known)   metoprolol tartrate (LOPRESSOR) 25 MG tablet   2. Which pharmacy/location (including street and city if local pharmacy) is medication to be sent to?  [place]   3. Do they need a 30 day or 90 day supply?   90 day  Patient stated he has 2 tablets left.

## 2023-01-19 NOTE — Telephone Encounter (Signed)
Pt's medication was sent to pt's pharmacy as requested. Confirmation received.  °

## 2023-01-27 ENCOUNTER — Encounter: Payer: Self-pay | Admitting: Physician Assistant

## 2023-01-27 ENCOUNTER — Ambulatory Visit: Payer: Self-pay | Attending: Physician Assistant | Admitting: Physician Assistant

## 2023-01-27 VITALS — BP 124/76 | HR 103 | Ht 76.0 in | Wt 292.6 lb

## 2023-01-27 DIAGNOSIS — E7800 Pure hypercholesterolemia, unspecified: Secondary | ICD-10-CM

## 2023-01-27 DIAGNOSIS — I2510 Atherosclerotic heart disease of native coronary artery without angina pectoris: Secondary | ICD-10-CM

## 2023-01-27 DIAGNOSIS — I4510 Unspecified right bundle-branch block: Secondary | ICD-10-CM

## 2023-01-27 DIAGNOSIS — I4821 Permanent atrial fibrillation: Secondary | ICD-10-CM | POA: Insufficient documentation

## 2023-01-27 DIAGNOSIS — I10 Essential (primary) hypertension: Secondary | ICD-10-CM

## 2023-01-27 MED ORDER — CLOPIDOGREL BISULFATE 75 MG PO TABS: 75.0000 mg | ORAL_TABLET | Freq: Every day | ORAL | 3 refills | Status: DC

## 2023-01-27 MED ORDER — LOSARTAN POTASSIUM 100 MG PO TABS: ORAL_TABLET | ORAL | 3 refills | Status: DC

## 2023-01-27 MED ORDER — METOPROLOL TARTRATE 25 MG PO TABS: 25.0000 mg | ORAL_TABLET | Freq: Two times a day (BID) | ORAL | 3 refills | Status: DC

## 2023-01-27 MED ORDER — NITROGLYCERIN 0.4 MG SL SUBL: 0.4000 mg | SUBLINGUAL_TABLET | SUBLINGUAL | 3 refills | Status: DC | PRN

## 2023-01-27 NOTE — Patient Instructions (Signed)
Medication Instructions:  Your physician has recommended you make the following change in your medication:   INCREASE the Lopressor to 25 mg taking 1 twice a day  *If you need a refill on your cardiac medications before your next appointment, please call your pharmacy*   Lab Work: 2-4 WEEKS:  FASTING LIPID & CMET  If you have labs (blood work) drawn today and your tests are completely normal, you will receive your results only by: [place] (if you have MyChart) OR A paper copy in the mail If you have any lab test that is abnormal or we need to change your treatment, we will call you to review the results.   Testing/Procedures: None ordered   Follow-Up: At [facility], you and your health needs are our priority.  As part of our continuing mission to provide you with exceptional heart care, we have created designated Provider Care Teams.  These Care Teams include your primary Cardiologist (physician) and Advanced Practice Providers (APPs -  Physician Assistants and Nurse Practitioners) who all work together to provide you with the care you need, when you need it.  We recommend signing up for the patient portal called "MyChart".  Sign up information is provided on this After Visit Summary.  MyChart is used to connect with patients for Virtual Visits (Telemedicine).  Patients are able to view lab/test results, encounter notes, upcoming appointments, etc.  Non-urgent messages can be sent to your provider as well.   To learn more about what you can do with MyChart, go to [email or web address].    Your next appointment:   1 year(s)  Provider:   Larae Grooms, MD     Other Instructions

## 2023-01-27 NOTE — Assessment & Plan Note (Signed)
This appears new on his electrocardiogram.  I will arrange a follow-up echocardiogram.

## 2023-01-27 NOTE — Assessment & Plan Note (Signed)
Blood pressure controlled.  Continue losartan 50 mg daily.  Adjust metoprolol tartrate to 25 mg twice daily for better rate control with atrial fibrillation.

## 2023-01-27 NOTE — Assessment & Plan Note (Signed)
Management of his atrial fibrillation has been limited due to cost.  He was unable to pursue cardioversion in the past because of this as well as to afford anticoagulation.  We had a long discussion regarding his stroke risk in the setting of atrial fibrillation and the benefits of medications like Eliquis or Coumadin.  I have suggested that he look into obtaining insurance through the affordable care act.  I have also provided forms to fill out to get approval for Eliquis.  If he opts to take Coumadin, we can arrange follow-up in our Coumadin clinic.  If he gets approved for assistance with Eliquis, we can get that started at that time.  His rate is somewhat borderline.  Increase metoprolol to tartrate to 25 mg twice daily.  Follow-up in 1 year.

## 2023-01-27 NOTE — Assessment & Plan Note (Signed)
History of inferior STEMI in 2018 treated a DES to the RCA.  His EF was initially low at 45-50 but returned to normal on echocardiogram in 2019.  He has not had symptoms to suggest angina.  Continue Plavix 75 mg daily, Lipitor 80 mg daily, metoprolol tartrate, as needed nitroglycerin.  If he decides to start anticoagulation, we can stop his Plavix at that time.

## 2023-01-27 NOTE — Assessment & Plan Note (Signed)
Continue Lipitor 80 mg daily.  Obtain follow-up fasting CMET, lipids.

## 2023-01-27 NOTE — Progress Notes (Signed)
Cardiology Office Note:   Date:  01/27/2023  ID:  Derek Herrera, DOB 1961/12/01, MRN [id number]  Patient Profile: Coronary artery disease  Inf STEMI s/p 3 x 20 mm DES to [facility] 02/15/2017 LHC:  EF 45-50, pRCA 99, mLAD 25, 40 TTE 07/28/18: mild LVH, EF 50-55, no RWMA, mild MR, mild LAE Ischemic CM Paroxysmal atrial fibrillation  S/p DCCV in 2012, 2013 Atrial flutter  Hypertension  Hyperlipidemia  Diabetes mellitus  OSA   History of Present Illness:   Derek Herrera is a 62 y.o. male with the above problem list.  Magda Paganini "Randall Hiss" was last seen by Dr. Irish Lack in 12/2021. He was not on anticoagulation due to cost. His rate remained controlled in AFib/Flutter. He returns for f/u on CAD, AFib/Flutter.  He is here alone.  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  He does not have insurance.  He is still concerned about trying to obtain Eliquis due to cost.  We had a long discussion today regarding stroke risk in the setting of atrial fibrillation and how medications like Eliquis or Coumadin reduce risk of stroke better than Plavix.  He has not had chest pain, syncope, leg edema.  He sleeps in a recliner chronically.  He has shortness of breath with some activities.  This is overall stable without change.  He does not use CPAP.  EKG: Atrial fibrillation, HR 103, right bundle branch block, QTc 450     ROS  Objective   Labs/Other Test Reviewed:     Risk Assessment/Calculations/Metrics:    CHA2DS2-VASc Score = 3   This indicates a 3.2% annual risk of stroke. The patient's score is based upon: CHF History: 0 HTN History: 1 Diabetes History: 1 Stroke History: 0 Vascular Disease History: 1 Age Score: 0 Gender Score: 0            Physical Exam:   VS:  BP 124/76   Pulse (!) 103   Ht '6\' 4"'$  (1.93 m)   Wt 292 lb 9.6 oz (132.7 kg)   SpO2 100%   BMI 35.62 kg/m    Wt Readings from Last 3 Encounters:  01/27/23 292 lb 9.6 oz (132.7 kg)  12/13/21 282 lb (127.9 kg)   07/13/20 288 lb 3.2 oz (130.7 kg)    Constitutional:      Appearance: Healthy appearance. Not in distress.  Neck:     Vascular: No carotid bruit. JVD normal.  Pulmonary:     Breath sounds: Normal breath sounds. No wheezing. No rales.  Cardiovascular:     Normal rate. Regularly irregular rhythm. Normal S1. Normal S2.      Murmurs: There is no murmur.  Edema:    Peripheral edema present.    Pretibial: bilateral trace edema of the pretibial area. Abdominal:     Palpations: Abdomen is soft.     Assessment & Plan    ASSESSMENT & PLAN:   CAD (coronary artery disease) History of inferior STEMI in 2018 treated a DES to the RCA.  His EF was initially low at 45-50 but returned to normal on echocardiogram in 2019.  He has not had symptoms to suggest angina.  Continue Plavix 75 mg daily, Lipitor 80 mg daily, metoprolol tartrate, as needed nitroglycerin.  If he decides to start anticoagulation, we can stop his Plavix at that time.  Permanent atrial fibrillation (HCC) Management of his atrial fibrillation has been limited due to cost.  He was unable to pursue cardioversion in  the past because of this as well as to afford anticoagulation.  We had a long discussion regarding his stroke risk in the setting of atrial fibrillation and the benefits of medications like Eliquis or Coumadin.  I have suggested that he look into obtaining insurance through the affordable care act.  I have also provided forms to fill out to get approval for Eliquis.  If he opts to take Coumadin, we can arrange follow-up in our Coumadin clinic.  If he gets approved for assistance with Eliquis, we can get that started at that time.  His rate is somewhat borderline.  Increase metoprolol to tartrate to 25 mg twice daily.  Follow-up in 1 year.  Right bundle branch block This appears new on his electrocardiogram.  I will arrange a follow-up echocardiogram.  Essential hypertension Blood pressure controlled.  Continue losartan 50 mg  daily.  Adjust metoprolol tartrate to 25 mg twice daily for better rate control with atrial fibrillation.  HYPERCHOLESTEROLEMIA Continue Lipitor 80 mg daily.  Obtain follow-up fasting CMET, lipids.             Dispo:  Return in about 1 year (around 01/28/2024) for Routine Follow Up with Dr. Irish Lack.   Signed, Richardson Dopp, PA-C  01/27/2023 5:54 PM    [address], [place], [place]  [address] Phone: [phone]; Fax: [phone]

## 2023-01-28 ENCOUNTER — Telehealth: Payer: Self-pay | Admitting: *Deleted

## 2023-01-28 DIAGNOSIS — I4510 Unspecified right bundle-branch block: Secondary | ICD-10-CM

## 2023-01-28 DIAGNOSIS — I2510 Atherosclerotic heart disease of native coronary artery without angina pectoris: Secondary | ICD-10-CM

## 2023-01-28 NOTE — Addendum Note (Signed)
Addended by: Janan Halter F on: 01/28/2023 09:26 AM   Modules accepted: Orders

## 2023-01-28 NOTE — Telephone Encounter (Signed)
-----   Message from Liliane Shi, [place] sent at 01/27/2023  5:51 PM EST ----- Regarding: Please schedule echo I forgot to tell him when he was here, we need to get an echocardiogram on him. His EKG has a Right Bundle Branch Block. With his hx of atrial fibrillation, coronary artery disease, sleep apnea, we should get an echocardiogram to make sure nothing has changed.  If he has questions, let me know and I can talk to him. Thanks [employer]

## 2023-01-28 NOTE — Telephone Encounter (Signed)
Call placed to pt regarding message below.  Left detailed message that we will order a Echocardiogram and someone from the office will call him to arrange.  If pt has questions, asked him to call me.

## 2023-02-19 ENCOUNTER — Other Ambulatory Visit: Payer: Self-pay | Admitting: Interventional Cardiology

## 2023-02-27 ENCOUNTER — Ambulatory Visit: Payer: Self-pay | Attending: Physician Assistant

## 2023-02-27 ENCOUNTER — Ambulatory Visit (HOSPITAL_COMMUNITY): Payer: Managed Care, Other (non HMO) | Attending: Physician Assistant

## 2023-02-27 DIAGNOSIS — I2510 Atherosclerotic heart disease of native coronary artery without angina pectoris: Secondary | ICD-10-CM

## 2023-02-27 DIAGNOSIS — I4510 Unspecified right bundle-branch block: Secondary | ICD-10-CM

## 2023-02-27 LAB — ECHOCARDIOGRAM COMPLETE: S' Lateral: 3.5 cm

## 2023-02-27 MED ORDER — PERFLUTREN LIPID MICROSPHERE
1.0000 mL | INTRAVENOUS | Status: AC | PRN
  Administered 2023-02-27: 2 mL via INTRAVENOUS

## 2023-02-28 LAB — LIPID PANEL
Chol/HDL Ratio: 2.5 ratio (ref 0.0–5.0)
Cholesterol, Total: 124 mg/dL (ref 100–199)
HDL: 50 mg/dL (ref >39)
LDL Chol Calc (NIH): 62 mg/dL (ref 0–99)
Triglycerides: 50 mg/dL (ref 0–149)
VLDL Cholesterol Cal: 12 mg/dL (ref 5–40)

## 2023-02-28 LAB — COMPREHENSIVE METABOLIC PANEL
ALT: 26 IU/L (ref 0–44)
AST: 26 IU/L (ref 0–40)
Albumin/Globulin Ratio: 1.7 (ref 1.2–2.2)
Albumin: 4 g/dL (ref 3.9–4.9)
Alkaline Phosphatase: 96 IU/L (ref 44–121)
BUN/Creatinine Ratio: 15 (ref 10–24)
BUN: 13 mg/dL (ref 8–27)
Bilirubin Total: 0.7 mg/dL (ref 0.0–1.2)
CO2: 25 mmol/L (ref 20–29)
Calcium: 9.1 mg/dL (ref 8.6–10.2)
Chloride: 107 mmol/L — ABNORMAL HIGH (ref 96–106)
Creatinine, Ser: 0.89 mg/dL (ref 0.76–1.27)
Globulin, Total: 2.4 g/dL (ref 1.5–4.5)
Glucose: 144 mg/dL — ABNORMAL HIGH (ref 70–99)
Potassium: 4.9 mmol/L (ref 3.5–5.2)
Sodium: 142 mmol/L (ref 134–144)
Total Protein: 6.4 g/dL (ref 6.0–8.5)
eGFR: 97 mL/min/{1.73_m2} (ref >59)

## 2023-04-18 ENCOUNTER — Other Ambulatory Visit: Payer: Self-pay | Admitting: Interventional Cardiology

## 2023-09-14 ENCOUNTER — Other Ambulatory Visit: Payer: Self-pay | Admitting: Physician Assistant

## 2023-09-16 ENCOUNTER — Other Ambulatory Visit: Payer: Self-pay

## 2023-09-16 MED ORDER — LOSARTAN POTASSIUM 100 MG PO TABS: ORAL_TABLET | ORAL | 1 refills | Status: DC

## 2024-01-05 ENCOUNTER — Other Ambulatory Visit: Payer: Self-pay | Admitting: Physician Assistant

## 2024-01-16 ENCOUNTER — Other Ambulatory Visit: Payer: Self-pay | Admitting: Physician Assistant

## 2024-01-18 ENCOUNTER — Other Ambulatory Visit: Payer: Self-pay

## 2024-01-18 MED ORDER — CLOPIDOGREL BISULFATE 75 MG PO TABS: 75.0000 mg | ORAL_TABLET | Freq: Every day | ORAL | 0 refills | Status: DC

## 2024-02-06 ENCOUNTER — Other Ambulatory Visit: Payer: Self-pay | Admitting: Physician Assistant

## 2024-02-11 ENCOUNTER — Other Ambulatory Visit: Payer: Self-pay | Admitting: Physician Assistant

## 2024-03-04 ENCOUNTER — Other Ambulatory Visit: Payer: Self-pay | Admitting: Physician Assistant

## 2024-03-22 ENCOUNTER — Encounter: Payer: Self-pay | Admitting: Physician Assistant

## 2024-03-22 DIAGNOSIS — I77810 Thoracic aortic ectasia: Secondary | ICD-10-CM

## 2024-03-22 HISTORY — DX: Thoracic aortic ectasia: I77.810

## 2024-03-22 NOTE — Progress Notes (Signed)
 "    Cardiology Office Note:    Date:  03/23/2024  ID:  [name], DOB Apr 03, 1961, MRN [id number] PCP: [name], MD  [facility] Providers Cardiologist:  Candyce Reek, MD       Patient Profile:      Coronary artery disease  Inf STEMI s/p 3 x 20 mm DES to [facility] 02/15/2017 LHC 01/2017:  EF 45-50, pRCA 99, mLAD 25, 40 TTE 07/28/18: mild LVH, EF 50-55, no RWMA, mild MR, mild LAE TTE 02/27/2023: EF 60-65, no RWMA, mild concentric LVH, normal RVSF, normal PASP, moderate LAE, trivial MR, borderline aortic stenosis (mean gradient 9.8 mmHg, V-max 200 cm/s) borderline dilation of aortic root 40 mm, borderline dilation of ascending aorta 40 mm Ischemic CM Paroxysmal atrial fibrillation  S/p DCCV in 2012, 2013 Atrial flutter  Ascending aorta dilation TTE 01/2023: Aortic root 40 mm, ascending aorta 40 mm Right Bundle Branch Block  Hypertension  Hyperlipidemia  Diabetes mellitus  OSA         Discussed the use of AI scribe software for clinical note transcription with the patient, who gave verbal consent to proceed.  History of Present Illness Derek Herrera is a 63 y.o. male who returns for follow-up of CAD, atrial fibrillation, dilated ascending aorta.  He was last seen in February 2024.  Management of atrial fibrillation has been difficult due to cost.  He has not been able to afford Eliquis  and does not want to take Coumadin.  He is here alone.  He is doing well without chest symptoms such as heaviness, pressure, or tightness.  He has not used nitroglycerin  in the past year and a half to two years. No dizziness, syncope, or palpitations. 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 He experiences shortness of breath when pushing a wheelbarrow or walking up a steep hill but can walk a couple of blocks on level ground without stopping. No orthopnea or leg  swelling, although a salty diet can cause temporary swelling. He has sleep apnea and has been sleeping in a chair for the past seven years, which he finds comfortable and without symptoms of apnea.   ROS-See HPI    Studies Reviewed:   EKG Interpretation Date/Time:  Wednesday March 23 2024 08:33:37 EDT Ventricular Rate:  73 PR Interval:    QRS Duration:  140 QT Interval:  422 QTC Calculation: 464 R Axis:   -19  Text Interpretation: Atrial fibrillation Right bundle branch block No significant change since last tracing Confirmed by Lelon Hamilton [phone]) on 03/23/2024 9:01:12 AM   Results Labs-chart review 02/27/2023: K 4.9, creatinine 0.89, ALT 26, total cholesterol 124, HDL 50, triglycerides 50, LDL 62    Risk Assessment/Calculations:    CHA2DS2-VASc Score = 3   This indicates a 3.2% annual risk of stroke. The patient's score is based upon: CHF History: 0 HTN History: 1 Diabetes History: 1 Stroke History: 0 Vascular Disease History: 1 Age Score: 0 Gender Score: 0    HYPERTENSION CONTROL Vitals:   03/23/24 0829 03/23/24 1700  BP: (!) 140/80 (!) 144/90    The patient's blood pressure is elevated above target today.  In order to address the patient's elevated BP: A new medication was prescribed today.          Physical Exam:   VS:  BP (!) 144/90   Pulse 73  Ht 6' 4 (1.93 m)   Wt (!) 300 lb 6.4 oz (136.3 kg)   SpO2 98%   BMI 36.57 kg/m    Wt Readings from Last 3 Encounters:  03/23/24 (!) 300 lb 6.4 oz (136.3 kg)  01/27/23 292 lb 9.6 oz (132.7 kg)  12/13/21 282 lb (127.9 kg)    Constitutional:      Appearance: Healthy appearance. Not in distress.  Neck:     Vascular: No carotid bruit. JVD normal.  Pulmonary:     Breath sounds: No wheezing. No rales.  Cardiovascular:     Normal rate. Irregularly irregular rhythm.     Murmurs: There is no murmur.  Edema:    Pretibial: bilateral trace edema of the pretibial area. Abdominal:     Palpations: Abdomen is  soft.        Assessment and Plan:   Assessment & Plan Coronary artery disease involving native coronary artery of native heart with other form of angina pectoris (HCC) Coronary artery disease status post inferior STEMI in March 2018, treated with a DES to the RCA. EF improved from 45-50% at the time of myocardial infarction to 60-65% by echocardiogram in March 2024.  He is not having chest symptoms to suggest angina.  He is now willing to take Eliquis  as he has insurance coverage. - Stop Plavix  as he will be starting on Eliquis  - Continue Lipitor 80 mg daily - Continue losartan  50 mg daily - Change metoprolol  to tartrate to carvedilol  6.25 mg twice daily - Continue nitroglycerin  PRN Permanent atrial fibrillation (HCC) He remains in atrial fibrillation. CHA2DS2-VASc Score = 3 [CHF History: 0, HTN History: 1, Diabetes History: 1, Stroke History: 0, Vascular Disease History: 1, Age Score: 0, Gender Score: 0].  Therefore, the patient's annual risk of stroke is 3.2 %. He had previously declined anticoagulation therapy due to cost of Eliquis .  He now has insurance coverage and would like to start on this.    - Stop Plavix  - Start Eliquis  5 mg twice daily - Change metoprolol  to tartrate to carvedilol  6.25 mg twice daily - Plan CBC and CMET in 3 months Ascending aorta dilation (HCC) Dilated ascending aorta with borderline dilation at 40 mm. Last echocardiogram in March 2024 showed mild LVH and normal PASP.   - Order follow-up echocardiogram  Essential hypertension Blood pressure above goal.  He notes of recent weight gain of 30 pounds which may have contributed to increased blood pressure. Current regimen includes losartan  and metoprolol  tartrate. Plan to switch metoprolol  to carvedilol  for better blood pressure control. - Stop metoprolol  tartrate - Start carvedilol  6.25 mg twice daily - Continue losartan  50 mg daily Hyperlipidemia Hyperlipidemia managed with Lipitor 80 mg daily. Last LDL  cholesterol was 62 mg/dL in March 2024, which is within the target range. - Continue Lipitor 80 mg daily - Plan CMET, lipid panel in 3 months      Dispo:  Return in about 6 months (around 09/22/2024) for Routine Follow Up, w/ Dr. Jeffrie, or Glendia Ferrier, PA-C.  Signed, Glendia Ferrier, PA-C   "

## 2024-03-23 ENCOUNTER — Encounter: Payer: Self-pay | Admitting: Physician Assistant

## 2024-03-23 ENCOUNTER — Ambulatory Visit: Payer: Self-pay | Attending: Physician Assistant | Admitting: Physician Assistant

## 2024-03-23 VITALS — BP 144/90 | HR 73 | Ht 76.0 in | Wt 300.4 lb

## 2024-03-23 DIAGNOSIS — I77810 Thoracic aortic ectasia: Secondary | ICD-10-CM

## 2024-03-23 DIAGNOSIS — I4821 Permanent atrial fibrillation: Secondary | ICD-10-CM | POA: Diagnosis not present

## 2024-03-23 DIAGNOSIS — E7800 Pure hypercholesterolemia, unspecified: Secondary | ICD-10-CM

## 2024-03-23 DIAGNOSIS — I10 Essential (primary) hypertension: Secondary | ICD-10-CM | POA: Diagnosis not present

## 2024-03-23 DIAGNOSIS — I25118 Atherosclerotic heart disease of native coronary artery with other forms of angina pectoris: Secondary | ICD-10-CM

## 2024-03-23 MED ORDER — APIXABAN 5 MG PO TABS: 5.0000 mg | ORAL_TABLET | Freq: Two times a day (BID) | ORAL | Status: AC

## 2024-03-23 MED ORDER — CARVEDILOL 6.25 MG PO TABS: 6.2500 mg | ORAL_TABLET | Freq: Two times a day (BID) | ORAL | 3 refills | Status: AC

## 2024-03-23 MED ORDER — APIXABAN 5 MG PO TABS: 5.0000 mg | ORAL_TABLET | Freq: Two times a day (BID) | ORAL | 3 refills | Status: AC

## 2024-03-23 NOTE — Assessment & Plan Note (Signed)
 Hyperlipidemia managed with Lipitor 80 mg daily. Last LDL cholesterol was 62 mg/dL in March 2024, which is within the target range. - Continue Lipitor 80 mg daily - Plan CMET, lipid panel in 3 months

## 2024-03-23 NOTE — Assessment & Plan Note (Signed)
 Blood pressure above goal.  He notes of recent weight gain of 30 pounds which may have contributed to increased blood pressure. Current regimen includes losartan  and metoprolol  tartrate. Plan to switch metoprolol  to carvedilol  for better blood pressure control. - Stop metoprolol  tartrate - Start carvedilol  6.25 mg twice daily - Continue losartan  50 mg daily

## 2024-03-23 NOTE — Assessment & Plan Note (Signed)
 Coronary artery disease status post inferior STEMI in March 2018, treated with a DES to the RCA. EF improved from 45-50% at the time of myocardial infarction to 60-65% by echocardiogram in March 2024.  He is not having chest symptoms to suggest angina.  He is now willing to take Eliquis  as he has insurance coverage. - Stop Plavix  as he will be starting on Eliquis  - Continue Lipitor 80 mg daily - Continue losartan  50 mg daily - Change metoprolol  to tartrate to carvedilol  6.25 mg twice daily - Continue nitroglycerin  PRN

## 2024-03-23 NOTE — Assessment & Plan Note (Signed)
 He remains in atrial fibrillation. CHA2DS2-VASc Score = 3 [CHF History: 0, HTN History: 1, Diabetes History: 1, Stroke History: 0, Vascular Disease History: 1, Age Score: 0, Gender Score: 0].  Therefore, the patient's annual risk of stroke is 3.2 %. He had previously declined anticoagulation therapy due to cost of Eliquis .  He now has insurance coverage and would like to start on this.    - Stop Plavix  - Start Eliquis  5 mg twice daily - Change metoprolol  to tartrate to carvedilol  6.25 mg twice daily - Plan CBC and CMET in 3 months

## 2024-03-23 NOTE — Patient Instructions (Signed)
 Medication Instructions:  Your physician has recommended you make the following change in your medication:   STOP Metoprolol   STOP Plavix   START Carvedilol  (Coreg ) 6.25 taking 1 twice a day  START Eliquis  5 mg taking 1 twice a day (every 12 hours)  *If you need a refill on your cardiac medications before your next appointment, please call your pharmacy*  Lab Work: 3 MONTHS, GO TO A [facility], FASTING, FOR:  CMET, LIPID, & CBC  [facility] locations:   [facility] - [address] ([address] office) - [address] ([facility]) - [address] [phone]   [place] [facility] [address].    [place]  - [address]    [place] - [address] - [address] [employer] [facility]  - [address] - [address] Chief Technology Officer)  If you have labs (blood work) drawn today and your tests are completely normal, you will receive your results only by: [employer] (if you have MyChart) OR A paper copy in the mail If you have any lab test that is abnormal or we need to change your treatment, we will call you to review the results.  Testing/Procedures: Your physician has requested that you have an echocardiogram. Echocardiography is a painless test that uses sound waves to create images of your heart. It provides your doctor with information about the size and shape of your heart and how well your heart's chambers and valves are working. This procedure takes approximately one hour. There are no restrictions for this procedure. Please do NOT wear cologne, perfume, aftershave, or lotions (deodorant is allowed). Please arrive 15 minutes prior to your appointment time.  Please note: We ask at that you not bring children with you during ultrasound (echo/ vascular) testing. Due to room size and safety concerns, children are not allowed in the ultrasound  rooms during exams. Our front office staff cannot provide observation of children in our lobby area while testing is being conducted. An adult accompanying a patient to their appointment will only be allowed in the ultrasound room at the discretion of the ultrasound technician under special circumstances. We apologize for any inconvenience.   Follow-Up: At [facility], you and your health needs are our priority.  As part of our continuing mission to provide you with exceptional heart care, our providers are all part of one team.  This team includes your primary Cardiologist (physician) and Advanced Practice Providers or APPs (Physician Assistants and Nurse Practitioners) who all work together to provide you with the care you need, when you need it.  Your next appointment:   6 month(s)  Provider:   Marlyse Single, PA-C         We recommend signing up for the patient portal called "MyChart".  Sign up information is provided on this After Visit Summary.  MyChart is used to connect with patients for Virtual Visits (Telemedicine).  Patients are able to view lab/test results, encounter notes, upcoming appointments, etc.  Non-urgent messages can be sent to your provider as well.   To learn more about what you can do with MyChart, go to [email or web address].   Other Instructions       1st Floor: - Lobby - Registration  - Pharmacy  - Lab - Cafe  2nd Floor: - PV Lab - Diagnostic Testing (echo, CT, nuclear  med)  3rd Floor: - Vacant  4th Floor: - TCTS (cardiothoracic surgery) - AFib Clinic - Structural Heart Clinic - Vascular Surgery  - Vascular Ultrasound  5th Floor: - HeartCare Cardiology (general and EP) - Clinical Pharmacy for coumadin, hypertension, lipid, weight-loss medications, and med management appointments    Valet parking services will be available as well.

## 2024-03-23 NOTE — Assessment & Plan Note (Signed)
 Dilated ascending aorta with borderline dilation at 40 mm. Last echocardiogram in March 2024 showed mild LVH and normal PASP.   - Order follow-up echocardiogram

## 2024-03-30 ENCOUNTER — Other Ambulatory Visit: Payer: Self-pay | Admitting: Physician Assistant

## 2024-04-05 ENCOUNTER — Other Ambulatory Visit: Payer: Self-pay

## 2024-04-05 ENCOUNTER — Other Ambulatory Visit: Payer: Self-pay | Admitting: Physician Assistant

## 2024-04-05 MED ORDER — ATORVASTATIN CALCIUM 80 MG PO TABS: 80.0000 mg | ORAL_TABLET | Freq: Every day | ORAL | 3 refills | Status: AC

## 2024-04-26 ENCOUNTER — Telehealth (HOSPITAL_COMMUNITY): Payer: Self-pay | Admitting: Physician Assistant

## 2024-04-26 NOTE — Telephone Encounter (Signed)
 Patient cancelled echocardiogram scheduled for 04/26/24 for reason below:  04/26/24 LVM to call office to reschedule/LBW  04/26/24 Auto Confirm Status: TEXT NO-LVM we cancelled per text and to call office to reschedule @ 11:21/LBW   Order will be removed from the echo Wq and when patient calls back we will reinstate the order. Thank you.

## 2024-04-28 ENCOUNTER — Ambulatory Visit (HOSPITAL_COMMUNITY)

## 2024-06-14 ENCOUNTER — Ambulatory Visit (HOSPITAL_COMMUNITY)
Admission: RE | Admit: 2024-06-14 | Discharge: 2024-06-14 | Disposition: A | Discharge status: Home / Self Care | Payer: PRIVATE HEALTH INSURANCE | Source: Ambulatory Visit | Attending: Internal Medicine | Admitting: Internal Medicine

## 2024-06-14 ENCOUNTER — Telehealth (HOSPITAL_COMMUNITY): Payer: Self-pay | Admitting: Physician Assistant

## 2024-06-14 DIAGNOSIS — I4821 Permanent atrial fibrillation: Secondary | ICD-10-CM

## 2024-06-14 DIAGNOSIS — E7800 Pure hypercholesterolemia, unspecified: Secondary | ICD-10-CM

## 2024-06-14 DIAGNOSIS — I10 Essential (primary) hypertension: Secondary | ICD-10-CM

## 2024-06-14 DIAGNOSIS — I77810 Thoracic aortic ectasia: Secondary | ICD-10-CM

## 2024-06-14 DIAGNOSIS — I25118 Atherosclerotic heart disease of native coronary artery with other forms of angina pectoris: Secondary | ICD-10-CM

## 2024-06-14 NOTE — Telephone Encounter (Signed)
 [address] [place], [place]    06/14/2024 12:28 PM

## 2024-06-14 NOTE — Telephone Encounter (Signed)
 Pt cancelled  Echocardiogram at tme of the appointment due to he is SELF PAY and did not wish to have. [facility] and will[place] [facility]. Order will be removed from the echo WQ. Thank you.

## 2024-08-24 ENCOUNTER — Ambulatory Visit: Admitting: Physician Assistant

## 2024-08-24 ENCOUNTER — Ambulatory Visit: Payer: PRIVATE HEALTH INSURANCE | Admitting: Cardiology

## 2024-09-26 ENCOUNTER — Other Ambulatory Visit: Payer: Self-pay | Admitting: Physician Assistant

## 2024-12-27 ENCOUNTER — Other Ambulatory Visit: Payer: Self-pay | Admitting: Physician Assistant
